# Patient Record
Sex: Female | Born: 1970 | Race: White | Hispanic: Yes | Marital: Married | Smoking: Never smoker
Health system: Southern US, Community
[De-identification: ages and names within clinical notes are randomized; demographics above are authoritative.]

## PROBLEM LIST (undated history)

## (undated) DIAGNOSIS — F419 Anxiety disorder, unspecified: Secondary | ICD-10-CM

## (undated) DIAGNOSIS — Z8669 Personal history of other diseases of the nervous system and sense organs: Secondary | ICD-10-CM

## (undated) DIAGNOSIS — E785 Hyperlipidemia, unspecified: Secondary | ICD-10-CM

## (undated) DIAGNOSIS — K219 Gastro-esophageal reflux disease without esophagitis: Secondary | ICD-10-CM

## (undated) DIAGNOSIS — E039 Hypothyroidism, unspecified: Secondary | ICD-10-CM

## (undated) DIAGNOSIS — E079 Disorder of thyroid, unspecified: Secondary | ICD-10-CM

## (undated) DIAGNOSIS — Z30430 Encounter for insertion of intrauterine contraceptive device: Secondary | ICD-10-CM

## (undated) HISTORY — DX: Hypothyroidism, unspecified: E03.9

## (undated) HISTORY — PX: NASAL SEPTUM SURGERY: SHX37

## (undated) HISTORY — PX: LIPOSUCTION: SHX10

## (undated) HISTORY — DX: Disorder of thyroid, unspecified: E07.9

## (undated) HISTORY — DX: Hyperlipidemia, unspecified: E78.5

## (undated) HISTORY — PX: CERVICAL DISCECTOMY: SHX98

## (undated) HISTORY — PX: RADIUS OSTEOTOMY: SHX1038

## (undated) HISTORY — DX: Encounter for insertion of intrauterine contraceptive device: Z30.430

## (undated) HISTORY — DX: Personal history of other diseases of the nervous system and sense organs: Z86.69

## (undated) HISTORY — PX: TONSILLECTOMY: SUR1361

---

## 2011-11-09 DIAGNOSIS — Z30430 Encounter for insertion of intrauterine contraceptive device: Secondary | ICD-10-CM

## 2011-11-09 HISTORY — DX: Encounter for insertion of intrauterine contraceptive device: Z30.430

## 2014-07-13 ENCOUNTER — Telehealth: Payer: Self-pay | Admitting: Obstetrics and Gynecology

## 2014-07-13 NOTE — Telephone Encounter (Signed)
Attempted to leave msg, spouse scheduled this appointment. I forgot to ask if his wife needs an interpreter.

## 2014-08-05 ENCOUNTER — Encounter: Payer: Self-pay | Admitting: Obstetrics and Gynecology

## 2014-09-30 ENCOUNTER — Ambulatory Visit (INDEPENDENT_AMBULATORY_CARE_PROVIDER_SITE_OTHER): Payer: BC Managed Care – PPO | Admitting: Obstetrics and Gynecology

## 2014-09-30 ENCOUNTER — Encounter: Payer: Self-pay | Admitting: Obstetrics and Gynecology

## 2014-09-30 VITALS — BP 108/68 | HR 70 | Resp 14 | Ht 67.0 in | Wt 174.0 lb

## 2014-09-30 DIAGNOSIS — Z0000 Encounter for general adult medical examination without abnormal findings: Secondary | ICD-10-CM

## 2014-09-30 DIAGNOSIS — E785 Hyperlipidemia, unspecified: Secondary | ICD-10-CM

## 2014-09-30 DIAGNOSIS — E039 Hypothyroidism, unspecified: Secondary | ICD-10-CM | POA: Insufficient documentation

## 2014-09-30 DIAGNOSIS — Z01419 Encounter for gynecological examination (general) (routine) without abnormal findings: Secondary | ICD-10-CM

## 2014-09-30 HISTORY — DX: Hyperlipidemia, unspecified: E78.5

## 2014-09-30 HISTORY — DX: Hypothyroidism, unspecified: E03.9

## 2014-09-30 LAB — POCT URINALYSIS DIPSTICK
Leukocytes, UA: NEGATIVE
Protein, UA: 0
Urobilinogen, UA: NEGATIVE
pH, UA: 5

## 2014-09-30 LAB — CBC
HCT: 42.2 % (ref 36.0–46.0)
Hemoglobin: 14.8 g/dL (ref 12.0–15.0)
MCH: 30 pg (ref 26.0–34.0)
MCHC: 35.1 g/dL (ref 30.0–36.0)
MCV: 85.4 fL (ref 78.0–100.0)
PLATELETS: 265 10*3/uL (ref 150–400)
RBC: 4.94 MIL/uL (ref 3.87–5.11)
RDW: 13.7 % (ref 11.5–15.5)
WBC: 7 10*3/uL (ref 4.0–10.5)

## 2014-09-30 LAB — HEMOGLOBIN, FINGERSTICK: HEMOGLOBIN, FINGERSTICK: 14.6 g/dL (ref 12.0–16.0)

## 2014-09-30 NOTE — Progress Notes (Signed)
GYNECOLOGY VISIT  PCP: None  Referring provider:  None  HPI: 43 y.o.   Married  Caucasian  female from [place]. [id number] with No LMP recorded. Patient is not currently having periods (Reason: IUD).   here for annual exam.  Has Mirena IUD and likes it.  Every 2 - 3 months has very little bleeding.  LMP was really 2 year ago.  Sister with breast cancer diagnosed at age 31.  She did surgery, chemo, and radiation therapy.   Hgb:  14.6  Urine:  Negative  GYNECOLOGIC HISTORY: No LMP recorded. Patient is not currently having periods (Reason: IUD). Sexually active:  yes Partner preference:  Female  Contraception:   Mirena IUD Menopausal hormone therapy: none DES exposure:   no Blood transfusions:   no  Sexually transmitted diseases:   no GYN procedures and prior surgeries: none  Last mammogram: 04/2013- Normal  Last pap and high risk HPV testing:   2012- Normal  History of abnormal pap smear:  no   OB History   Grav Para Term Preterm Abortions TAB SAB Ect Mult Living   2 2 2       2        LIFESTYLE: Exercise:   No current exercise at the moment              OTHER HEALTH MAINTENANCE: Tetanus/TDap: 02/2011 HPV: n/a Influenza:  none   Bone density:  n/a Colonoscopy: n/a  Cholesterol check:  no  Family History  Problem Relation Age of Onset  . Stomach cancer Father   . Breast cancer Sister   . Stomach cancer Paternal Grandmother   . Stomach cancer Paternal Aunt   . Heart attack Mother   . Stroke Mother     There are no active problems to display for this patient.  Past Medical History  Diagnosis Date  . Encounter for insertion of mirena IUD 11/09/2011  . Thyroid disease   . Hypothyroidism     Past Surgical History  Procedure Laterality Date  . Nasal septum surgery    . Cesarean section  x2  . Tonsillectomy      ALLERGIES: Review of patient's allergies indicates no known allergies.  Current Outpatient Prescriptions  Medication Sig Dispense Refill  .  levonorgestrel (MIRENA) 20 MCG/24HR IUD 1 each by Intrauterine route once. Inserted 11/09/11      . levothyroxine (SYNTHROID, LEVOTHROID) 88 MCG tablet Take 88 mcg by mouth daily before breakfast.       No current facility-administered medications for this visit.     ROS:  Pertinent items are noted in HPI.  History   Social History  . Marital Status: Married    Spouse Name: N/A    Number of Children: N/A  . Years of Education: N/A   Occupational History  . Not on file.   Social History Main Topics  . Smoking status: Never Smoker   . Smokeless tobacco: Never Used  . Alcohol Use: No  . Drug Use: No  . Sexual Activity: Yes    Partners: Male    Birth Control/ Protection: IUD   Other Topics Concern  . Not on file   Social History Narrative  . No narrative on file    PHYSICAL EXAMINATION:    BP 108/68  Pulse 70  Resp 14  Ht 5' 7"  (1.702 m)  Wt 174 lb (78.926 kg)  BMI 27.25 kg/m2   Wt Readings from Last 3 Encounters:  09/30/14 174 lb (78.926 kg)  Ht Readings from Last 3 Encounters:  09/30/14 5' 7"  (1.702 m)    General appearance: alert, cooperative and appears stated age Head: Normocephalic, without obvious abnormality, atraumatic Neck: no adenopathy, supple, symmetrical, trachea midline and thyroid not enlarged, symmetric, no tenderness/mass/nodules Lungs: clear to auscultation bilaterally Breasts: Inspection negative, No nipple retraction or dimpling, No nipple discharge or bleeding, No axillary or supraclavicular adenopathy, Normal to palpation without dominant masses Heart: regular rate and rhythm Abdomen: soft, non-tender; no masses,  no organomegaly Extremities: extremities normal, atraumatic, no cyanosis or edema Skin: Skin color, texture, turgor normal. No rashes or lesions Lymph nodes: Cervical, supraclavicular, and axillary nodes normal. No abnormal inguinal nodes palpated Neurologic: Grossly normal  Pelvic: External genitalia:  no lesions               Urethra:  normal appearing urethra with no masses, tenderness or lesions              Bartholins and Skenes: normal                 Vagina: normal appearing vagina with normal color and discharge, no lesions              Cervix: normal appearance.  IUD strings noted.  Dark blood in vagina.              Pap and high risk HPV testing done: Yes.          Bimanual Exam:  Uterus:  uterus is normal size, shape, consistency and nontender                                      Adnexa: normal adnexa in size, nontender and no masses                                      Rectovaginal:  Yes.                                        Confirms above.                                      Anus:  normal sphincter tone, no lesions  ASSESSMENT  Normal gynecologic exam. Mirena IUD patient.  Family history of premenopausal breast cancer.  Hypothyroidism.   PLAN  Mammogram recommended yearly starting at age 65. Discussed BRCA testing. Patient will ask her sister is this was done in [place]. Pap smear and high risk HPV testing as above. Counseled on self breast exam. See lab orders: Yes.   Patient will get her Synthroid Rx here in the future when she runs out. Return annually or prn   An After Visit Summary was printed and given to the patient.

## 2014-09-30 NOTE — Patient Instructions (Signed)
EXERCISE AND DIET:  We recommended that you start or continue a regular exercise program for good health. Regular exercise means any activity that makes your heart beat faster and makes you sweat.  We recommend exercising at least 30 minutes per day at least 3 days a week, preferably 4 or 5.  We also recommend a diet low in fat and sugar.  Inactivity, poor dietary choices and obesity can cause diabetes, heart attack, stroke, and kidney damage, among others.    ALCOHOL AND SMOKING:  Women should limit their alcohol intake to no more than 7 drinks/beers/glasses of wine (combined, not each!) per week. Moderation of alcohol intake to this level decreases your risk of breast cancer and liver damage. And of course, no recreational drugs are part of a healthy lifestyle.  And absolutely no smoking or even second hand smoke. Most people know smoking can cause heart and lung diseases, but did you know it also contributes to weakening of your bones? Aging of your skin?  Yellowing of your teeth and nails?  CALCIUM AND VITAMIN D:  Adequate intake of calcium and Vitamin D are recommended.  The recommendations for exact amounts of these supplements seem to change often, but generally speaking 600 mg of calcium (either carbonate or citrate) and 800 units of Vitamin D per day seems prudent. Certain women may benefit from higher intake of Vitamin D.  If you are among these women, your doctor will have told you during your visit.    PAP SMEARS:  Pap smears, to check for cervical cancer or precancers,  have traditionally been done yearly, although recent scientific advances have shown that most women can have pap smears less often.  However, every woman still should have a physical exam from her gynecologist every year. It will include a breast check, inspection of the vulva and vagina to check for abnormal growths or skin changes, a visual exam of the cervix, and then an exam to evaluate the size and shape of the uterus and  ovaries.  And after 43 years of age, a rectal exam is indicated to check for rectal cancers. We will also provide age appropriate advice regarding health maintenance, like when you should have certain vaccines, screening for sexually transmitted diseases, bone density testing, colonoscopy, mammograms, etc.   MAMMOGRAMS:  All women over 43 years old should have a yearly mammogram. Many facilities now offer a "3D" mammogram, which may cost around $50 extra out of pocket. If possible,  we recommend you accept the option to have the 3D mammogram performed.  It both reduces the number of women who will be called back for extra views which then turn out to be normal, and it is better than the routine mammogram at detecting truly abnormal areas.    COLONOSCOPY:  Colonoscopy to screen for colon cancer is recommended for all women at age 18.  We know, you hate the idea of the prep.  We agree, BUT, having colon cancer and not knowing it is worse!!  Colon cancer so often starts as a polyp that can be seen and removed at colonscopy, which can quite literally save your life!  And if your first colonoscopy is normal and you have no family history of colon cancer, most women don't have to have it again for 10 years.  Once every ten years, you can do something that may end up saving your life, right?  We will be happy to help you get it scheduled when you are ready.  Be sure to check your insurance coverage so you understand how much it will cost.  It may be covered as a preventative service at no cost, but you should check your particular policy.     BRCA-1 and BRCA-2 BRCA-1 and BRCA-2 are 2 genes that are linked with hereditary breast and ovarian cancers. About 200,000 women are diagnosed with invasive breast cancer each year and about 23,000 with ovarian cancer (according to the [place]). Of these cancers, about 5% to 10% will be due to a mutation in one of the BRCA genes. Men can also inherit an  increased risk of developing breast cancer, primarily from an alteration in the BRCA-2 gene.  Individuals with mutations in BRCA1 or BRCA2 have significantly elevated risks for breast cancer (up to 80% lifetime risk), ovarian cancer (up to 40% lifetime risk), bilateral breast cancer and other types of cancers. BRCA mutations are inherited and passed from generation to generation. One half of the time, they are passed from the father's side of the family.  The DNA in white blood cells is used to detect mutations in the BRCA genes. While the gene products (proteins) of the BRCA genes act only in breast and ovarian tissue, the genes are present in every cell of the body and blood is the most easily accessible source of that DNA. PREPARATION FOR TEST The test for BRCA mutations is done on a blood sample collected by needle from a vein in the arm. The test does not require surgical biopsy of breast or ovarian tissue.  NORMAL FINDINGS No genetic mutations. Ranges for normal findings may vary among different laboratories and hospitals. You should always check with your doctor after having lab work or other tests done to discuss the meaning of your test results and whether your values are considered within normal limits. MEANING OF TEST  Your caregiver will go over the test results with you and discuss the importance and meaning of your results, as well as treatment options and the need for additional tests if necessary. OBTAINING THE TEST RESULTS It is your responsibility to obtain your test results. Ask the lab or department performing the test when and how you will get your results. OTHER THINGS TO KNOW Your test results may have implications for other family members. When one member of a family is tested for BRCA mutations, issues often arise about how or whether to share this information with other family members. Seek advice from a genetic counselor about communication of result with your family members.   Pre and post test consultation with a health care provider knowledgeable about genetic testing cannot be overemphasized.  There are many issues to be considered when preparing for a genetic test and upon learning the results, and a genetic counselor has the knowledge and experience to help you sort through them.  If the BRCA test is positive, the options include increased frequency of check-ups (e.g., mammography, blood tests for CA-125, or transvaginal ultrasonography); medications that could reduce risk (e.g., oral contraceptives or tamoxifen); or surgical removal of the ovaries or breasts. There are a number of variables involved and it is important to discuss your options with your doctor and genetic counselor. Research studies have reported that for every 1000 women negative for BRCA mutations, between 12 and 18 of them will develop breast cancer by age 8 and between 68 and 3 will develop ovarian cancer by age 81. The risk increases with age. The test can be ordered by a doctor,  preferably by one who can also offer genetic counseling. The blood sample will be sent to a laboratory that specializes in BRCA testing. The American Society of Clinical Oncology and the [place] encourage women seeking the test to participate in long-term outcome studies to help gather information on the effectiveness of different check-up and treatment options. Document Released: 12/20/2004 Document Revised: 02/18/2012 Document Reviewed: 02/26/2014 [facility] Patient Information 2015 [place], [place]. This information is not intended to replace advice given to you by your health care provider. Make sure you discuss any questions you have with your health care provider.

## 2014-10-01 LAB — LIPID PANEL
CHOL/HDL RATIO: 5 ratio
Cholesterol: 229 mg/dL — ABNORMAL HIGH (ref 0–200)
HDL: 46 mg/dL (ref >39)
LDL Cholesterol: 140 mg/dL — ABNORMAL HIGH (ref 0–99)
Triglycerides: 217 mg/dL — ABNORMAL HIGH (ref <150)
VLDL: 43 mg/dL — AB (ref 0–40)

## 2014-10-01 LAB — COMPREHENSIVE METABOLIC PANEL
ALT: 19 U/L (ref 0–35)
AST: 14 U/L (ref 0–37)
Albumin: 3.9 g/dL (ref 3.5–5.2)
Alkaline Phosphatase: 70 U/L (ref 39–117)
BILIRUBIN TOTAL: 0.5 mg/dL (ref 0.2–1.2)
BUN: 13 mg/dL (ref 6–23)
CHLORIDE: 103 meq/L (ref 96–112)
CO2: 27 meq/L (ref 19–32)
Calcium: 9.7 mg/dL (ref 8.4–10.5)
Creat: 0.97 mg/dL (ref 0.50–1.10)
Glucose, Bld: 84 mg/dL (ref 70–99)
Potassium: 4.5 mEq/L (ref 3.5–5.3)
SODIUM: 140 meq/L (ref 135–145)
TOTAL PROTEIN: 6.8 g/dL (ref 6.0–8.3)

## 2014-10-01 LAB — THYROID PANEL WITH TSH
Free Thyroxine Index: 2.3 (ref 1.4–3.8)
T3 Uptake: 30 % (ref 22.0–35.0)
T4, Total: 7.6 ug/dL (ref 4.5–12.0)
TSH: 2.243 u[IU]/mL (ref 0.350–4.500)

## 2014-10-02 ENCOUNTER — Other Ambulatory Visit: Payer: Self-pay | Admitting: Obstetrics and Gynecology

## 2014-10-02 DIAGNOSIS — E785 Hyperlipidemia, unspecified: Secondary | ICD-10-CM

## 2014-10-04 ENCOUNTER — Telehealth: Payer: Self-pay

## 2014-10-04 LAB — IPS PAP TEST WITH HPV

## 2014-10-04 NOTE — Telephone Encounter (Signed)
Called patient at [phone] to discuss lab results, LMOVM to call me.

## 2014-10-04 NOTE — Telephone Encounter (Signed)
Message copied by Lowella Fairy on Mon Oct 04, 2014 11:29 AM ------      Message from: Judeth Horn DE Milana Kidney E      Created: Sat Oct 02, 2014  3:39 PM       Please report labs to patient.       Cholesterol is elevated and unfavorable ratios.      I would like for the patient to start an exercise program and follow a low fat and low cholesterol diet and then return in 3 months for fasting blood work.      All remaining blood work including thyroid was normal. ------

## 2014-10-11 NOTE — Telephone Encounter (Signed)
Patient notified and made lab appointment for 01-03-15 at 9:00am. Fasting.

## 2014-10-12 ENCOUNTER — Encounter: Payer: Self-pay | Admitting: Obstetrics and Gynecology

## 2014-10-20 ENCOUNTER — Other Ambulatory Visit: Payer: Self-pay

## 2014-10-20 DIAGNOSIS — Z1231 Encounter for screening mammogram for malignant neoplasm of breast: Secondary | ICD-10-CM

## 2014-10-26 ENCOUNTER — Ambulatory Visit
Admission: RE | Admit: 2014-10-26 | Discharge: 2014-10-26 | Disposition: A | Discharge status: Home / Self Care | Payer: BC Managed Care – PPO | Source: Ambulatory Visit

## 2014-10-26 DIAGNOSIS — Z1231 Encounter for screening mammogram for malignant neoplasm of breast: Secondary | ICD-10-CM

## 2014-10-28 ENCOUNTER — Other Ambulatory Visit: Payer: Self-pay | Admitting: Obstetrics and Gynecology

## 2014-10-28 DIAGNOSIS — R928 Other abnormal and inconclusive findings on diagnostic imaging of breast: Secondary | ICD-10-CM

## 2014-11-12 ENCOUNTER — Ambulatory Visit
Admission: RE | Admit: 2014-11-12 | Discharge: 2014-11-12 | Disposition: A | Discharge status: Home / Self Care | Payer: BC Managed Care – PPO | Source: Ambulatory Visit | Attending: Obstetrics and Gynecology | Admitting: Obstetrics and Gynecology

## 2014-11-12 DIAGNOSIS — R928 Other abnormal and inconclusive findings on diagnostic imaging of breast: Secondary | ICD-10-CM

## 2014-11-17 ENCOUNTER — Telehealth: Payer: Self-pay | Admitting: Emergency Medicine

## 2014-11-17 NOTE — Telephone Encounter (Signed)
-----   Message from Holley, MD sent at 11/14/2014 10:43 AM EST ----- Regarding: Please remove from mammogram hold Hi Olivia Mackie,   Follow up mammogram reviewed and normal.  OK to remove from mammogram hold.   Thanks,   [place]

## 2014-11-17 NOTE — Telephone Encounter (Signed)
Out of Mammogram hold.

## 2015-01-03 ENCOUNTER — Other Ambulatory Visit: Payer: BC Managed Care – PPO

## 2015-01-03 ENCOUNTER — Telehealth: Payer: Self-pay | Admitting: Obstetrics and Gynecology

## 2015-01-03 NOTE — Telephone Encounter (Signed)
Left message to call and reschedule missed lab appointment. °

## 2015-01-05 NOTE — Telephone Encounter (Signed)
Called pt. At [phone] to r/s missed lab appt. (fasting lipid profile), LMOVM to call me back.  Patient was schedule on 01-03-15 but did not keep appt.

## 2015-01-17 NOTE — Telephone Encounter (Signed)
Dr. Quincy Simmonds,  We have tried twice to contact patient to reschedule her 3 mo. Fasting lipid profile and she has not responded.  No further action?  Routed to Dr. Quincy Simmonds

## 2015-01-18 NOTE — Telephone Encounter (Signed)
No further action required.  Patient responsibility to return call and reschedule. Encounter closed.

## 2015-08-11 ENCOUNTER — Encounter: Payer: Self-pay | Admitting: Obstetrics and Gynecology

## 2015-08-11 ENCOUNTER — Telehealth: Payer: Self-pay | Admitting: Obstetrics and Gynecology

## 2015-08-11 NOTE — Telephone Encounter (Signed)
(  unable to lv msg mail box full) upcoming appointment has been canceled and needs to be rescheduled. Letter mailed

## 2015-10-05 ENCOUNTER — Ambulatory Visit: Payer: BC Managed Care – PPO | Admitting: Obstetrics and Gynecology

## 2015-10-09 ENCOUNTER — Encounter: Payer: Self-pay | Admitting: Obstetrics and Gynecology

## 2015-12-30 ENCOUNTER — Ambulatory Visit (INDEPENDENT_AMBULATORY_CARE_PROVIDER_SITE_OTHER): Payer: BLUE CROSS/BLUE SHIELD | Admitting: Pulmonary Disease

## 2015-12-30 ENCOUNTER — Encounter: Payer: Self-pay | Admitting: Pulmonary Disease

## 2015-12-30 ENCOUNTER — Other Ambulatory Visit (INDEPENDENT_AMBULATORY_CARE_PROVIDER_SITE_OTHER): Payer: BLUE CROSS/BLUE SHIELD

## 2015-12-30 ENCOUNTER — Ambulatory Visit (INDEPENDENT_AMBULATORY_CARE_PROVIDER_SITE_OTHER)
Admission: RE | Admit: 2015-12-30 | Discharge: 2015-12-30 | Disposition: A | Discharge status: Home / Self Care | Payer: BLUE CROSS/BLUE SHIELD | Source: Ambulatory Visit | Attending: Pulmonary Disease | Admitting: Pulmonary Disease

## 2015-12-30 VITALS — BP 132/84 | HR 90 | Ht 67.0 in | Wt 189.6 lb

## 2015-12-30 DIAGNOSIS — R0689 Other abnormalities of breathing: Secondary | ICD-10-CM | POA: Diagnosis not present

## 2015-12-30 DIAGNOSIS — R0600 Dyspnea, unspecified: Secondary | ICD-10-CM

## 2015-12-30 LAB — CBC WITH DIFFERENTIAL/PLATELET
BASOS ABS: 0.1 10*3/uL (ref 0.0–0.1)
Basophils Relative: 1.5 % (ref 0.0–3.0)
EOS ABS: 0.2 10*3/uL (ref 0.0–0.7)
Eosinophils Relative: 2.2 % (ref 0.0–5.0)
HEMATOCRIT: 44.9 % (ref 36.0–46.0)
HEMOGLOBIN: 15 g/dL (ref 12.0–15.0)
LYMPHS PCT: 27.2 % (ref 12.0–46.0)
Lymphs Abs: 2.3 10*3/uL (ref 0.7–4.0)
MCHC: 33.3 g/dL (ref 30.0–36.0)
MCV: 86.8 fl (ref 78.0–100.0)
MONOS PCT: 8.6 % (ref 3.0–12.0)
Monocytes Absolute: 0.7 10*3/uL (ref 0.1–1.0)
NEUTROS ABS: 5.2 10*3/uL (ref 1.4–7.7)
Neutrophils Relative %: 60.5 % (ref 43.0–77.0)
PLATELETS: 281 10*3/uL (ref 150.0–400.0)
RBC: 5.18 Mil/uL — AB (ref 3.87–5.11)
RDW: 13.8 % (ref 11.5–15.5)
WBC: 8.6 10*3/uL (ref 4.0–10.5)

## 2015-12-30 MED ORDER — ALBUTEROL SULFATE HFA 108 (90 BASE) MCG/ACT IN AERS: 2.0000 | INHALATION_SPRAY | Freq: Four times a day (QID) | RESPIRATORY_TRACT | Status: DC | PRN

## 2015-12-30 NOTE — Patient Instructions (Signed)
We will check chest x-ray and pulmonary function test, methacholine challenge test Blood tests for a complete blood count with differential, IgE levels. You will be prescribed an albuterol rescue inhaler to be used during episodes of chest pain and shortness of breath. Return to clinic in 1-2 months

## 2015-12-30 NOTE — Progress Notes (Signed)
Subjective:    Patient ID: Brianna [name], female    DOB: November 13, 1971, 45 y.o.   MRN: [id number]  HPI [name] is a 45 year old referred for evaluation of dyspnea, abnormal PFTs.  She complains of substernal chest tightness and chest pain for several years. This has exacerbated over the past 4 months. It occurs almost every day brought on when she is rushed, nervous, worried, anxious. This is associated with shortness of breath. There is no associated wheeze, cough, sputum production, palpitations. Sometimes the pain radiates to the back. She has seasonal allergies and GERD. She was started on prilocec recently and has an improvement in GERD symptoms. She has a cat and dog at home and does not report any sensitivity to those.  She has spirometry done 3 months ago which showed moderate obstruction. She was tried on [employer] but has not noticed any improvement in symptoms. She was born in [place] and moved to the [place] about 10 years ago. She does not have any TB exposure. She was vaccinated with BCG as a child. She was tested for latent TB at work 1 year ago with a CXR, PPD and a blood test. She reports that these were negative.   PFTs 09/01/15 FVC 3.32 (87%] FEV1 2.16 [69%) F/F 65 Moderate obstruction  Social history: She is a never smoker no alcohol or illegal drug use. 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 She does not report any exposures at work or at home.  Family history: Mother-heart disease Father, sister-cancer  Past Medical History  Diagnosis Date  . Encounter for insertion of mirena IUD 11/09/2011  . Thyroid disease   . Hypothyroidism 09/30/2014  . Hyperlipidemia 09/30/14    Current outpatient prescriptions:  .  acyclovir (ZOVIRAX) 400 MG tablet, Take 400 mg by mouth 3 (three) times daily as needed., Disp: , Rfl: 2 .  calcium carbonate (TUMS - DOSED IN MG ELEMENTAL CALCIUM) 500 MG chewable tablet, Chew 1 tablet by mouth 4 (four) times daily., Disp: , Rfl:   .  ibuprofen (ADVIL,MOTRIN) 200 MG tablet, Per bottle as needed, Disp: , Rfl:  .  levonorgestrel (MIRENA) 20 MCG/24HR IUD, 1 each by Intrauterine route once. Inserted 11/09/11, Disp: , Rfl:  .  levothyroxine (SYNTHROID, LEVOTHROID) 88 MCG tablet, Take 88 mcg by mouth daily before breakfast., Disp: , Rfl:  .  LORazepam (ATIVAN) 0.5 MG tablet, Take 0.5 mg by mouth daily as needed., Disp: , Rfl: 0 .  omeprazole (PRILOSEC) 40 MG capsule, Take 40 mg by mouth daily as needed., Disp: , Rfl:  .  albuterol (PROVENTIL HFA;VENTOLIN HFA) 108 (90 Base) MCG/ACT inhaler, Inhale 2 puffs into the lungs every 6 (six) hours as needed for wheezing or shortness of breath., Disp: 1 Inhaler, Rfl: 2  Review of Systems Denies any cough, sputum production, dyspnea, wheezing. Denies any palpitations, fevers, chills, loss of weight, loss of appetite, malaise, fatigue. Denies any nausea, vomiting, diarrhea, constipation. All other review of systems are negative    Objective:   Physical Exam Blood pressure 132/84, pulse 90, height 5\' 7"  (1.702 m), weight 189 lb 9.6 oz (86.002 kg), SpO2 97 %. Gen: No apparent distress Neuro: No gross focal deficits. Neck: No JVD, lymphadenopathy, thyromegaly. RS: Clear, no wheeze or crackles.  CVS: S1-S2 heard, no murmurs rubs gallops. Abdomen: Soft, positive bowel sounds. Extremities: No edema.    Assessment & Plan:  Dyspnea, atypical chest pain.  Symptoms are of unclear etiology. This may be related to her  anxiety. Her spirometry show moderate obstruction but she does not have any smoking history or known exposures. There is no family history of lung disease. She has not responded to [employer].  I will evaluate by getting a full set of lung function tests with lung volume and diffusion capacity, methacholine challenge test. She also get a chest x-ray and basic blood work. I will give her albuterol rescue inhaler.  Return to clinic in 1 month to review results and any other  workup if needed.  Plan: - PFTs, Methacholine challenge - CBC with diff, IgE - CXR - Albuterol rescue inhaler.  Marshell Garfinkel MD [facility] Pager [phone] If no answer or after 3pm call: [phone] 12/30/2015, 4:19 PM

## 2016-01-02 LAB — IGE: IGE (IMMUNOGLOBULIN E), SERUM: 14 kU/L (ref <115)

## 2016-01-09 ENCOUNTER — Ambulatory Visit (HOSPITAL_COMMUNITY)
Admission: RE | Admit: 2016-01-09 | Discharge: 2016-01-09 | Disposition: A | Discharge status: Home / Self Care | Payer: BLUE CROSS/BLUE SHIELD | Source: Ambulatory Visit | Attending: Pulmonary Disease | Admitting: Pulmonary Disease

## 2016-01-09 DIAGNOSIS — R0600 Dyspnea, unspecified: Secondary | ICD-10-CM | POA: Insufficient documentation

## 2016-01-09 LAB — PULMONARY FUNCTION TEST
FEF 25-75 POST: 2.32 L/s
FEF 25-75 PRE: 2.13 L/s
FEF2575-%CHANGE-POST: 9 %
FEF2575-%PRED-POST: 74 %
FEF2575-%Pred-Pre: 67 %
FEV1-%CHANGE-POST: 2 %
FEV1-%PRED-POST: 74 %
FEV1-%Pred-Pre: 73 %
FEV1-POST: 2.4 L
FEV1-Pre: 2.35 L
FEV1FVC-%Change-Post: 12 %
FEV1FVC-%PRED-PRE: 94 %
FEV6-%Change-Post: -9 %
FEV6-%Pred-Post: 71 %
FEV6-%Pred-Pre: 78 %
FEV6-POST: 2.79 L
FEV6-Pre: 3.08 L
FEV6FVC-%PRED-POST: 102 %
FEV6FVC-%Pred-Pre: 102 %
FVC-%Change-Post: -9 %
FVC-%PRED-POST: 69 %
FVC-%PRED-PRE: 76 %
FVC-PRE: 3.08 L
FVC-Post: 2.79 L
POST FEV1/FVC RATIO: 86 %
PRE FEV1/FVC RATIO: 76 %
Post FEV6/FVC ratio: 100 %
Pre FEV6/FVC Ratio: 100 %

## 2016-01-09 MED ORDER — METHACHOLINE 1 MG/ML NEB SOLN
2.0000 mL | Freq: Once | RESPIRATORY_TRACT | Status: AC
  Administered 2016-01-09: 2 mg via RESPIRATORY_TRACT

## 2016-01-09 MED ORDER — METHACHOLINE 4 MG/ML NEB SOLN
2.0000 mL | Freq: Once | RESPIRATORY_TRACT | Status: AC
  Administered 2016-01-09: 8 mg via RESPIRATORY_TRACT

## 2016-01-09 MED ORDER — ALBUTEROL SULFATE (2.5 MG/3ML) 0.083% IN NEBU
2.5000 mg | INHALATION_SOLUTION | Freq: Once | RESPIRATORY_TRACT | Status: AC
  Administered 2016-01-09: 2.5 mg via RESPIRATORY_TRACT

## 2016-01-09 MED ORDER — METHACHOLINE 0.25 MG/ML NEB SOLN
2.0000 mL | Freq: Once | RESPIRATORY_TRACT | Status: AC
  Administered 2016-01-09: 0.5 mg via RESPIRATORY_TRACT

## 2016-01-09 MED ORDER — METHACHOLINE 16 MG/ML NEB SOLN
2.0000 mL | Freq: Once | RESPIRATORY_TRACT | Status: AC
  Administered 2016-01-09: 32 mg via RESPIRATORY_TRACT

## 2016-01-09 MED ORDER — SODIUM CHLORIDE 0.9 % IN NEBU
3.0000 mL | INHALATION_SOLUTION | Freq: Once | RESPIRATORY_TRACT | Status: AC
  Administered 2016-01-09: 3 mL via RESPIRATORY_TRACT

## 2016-01-09 MED ORDER — METHACHOLINE 0.0625 MG/ML NEB SOLN
2.0000 mL | Freq: Once | RESPIRATORY_TRACT | Status: AC
  Administered 2016-01-09: 0.125 mg via RESPIRATORY_TRACT

## 2016-01-10 ENCOUNTER — Ambulatory Visit (HOSPITAL_COMMUNITY)
Admission: RE | Admit: 2016-01-10 | Discharge: 2016-01-10 | Disposition: A | Discharge status: Home / Self Care | Payer: BLUE CROSS/BLUE SHIELD | Source: Ambulatory Visit | Attending: Pulmonary Disease | Admitting: Pulmonary Disease

## 2016-01-10 DIAGNOSIS — R0600 Dyspnea, unspecified: Secondary | ICD-10-CM | POA: Diagnosis present

## 2016-01-10 LAB — PULMONARY FUNCTION TEST
DL/VA % PRED: 102 %
DL/VA: 5.3 ml/min/mmHg/L
DLCO cor % pred: 73 %
DLCO cor: 20.89 ml/min/mmHg
DLCO unc % pred: 77 %
DLCO unc: 21.85 ml/min/mmHg
FEF 25-75 Post: 3.16 L/sec
FEF 25-75 Pre: 2.91 L/sec
FEF2575-%CHANGE-POST: 8 %
FEF2575-%PRED-PRE: 93 %
FEF2575-%Pred-Post: 100 %
FEV1-%CHANGE-POST: 1 %
FEV1-%PRED-PRE: 76 %
FEV1-%Pred-Post: 77 %
FEV1-POST: 2.49 L
FEV1-PRE: 2.46 L
FEV1FVC-%CHANGE-POST: 4 %
FEV1FVC-%Pred-Pre: 102 %
FEV6-%CHANGE-POST: -2 %
FEV6-%PRED-PRE: 74 %
FEV6-%Pred-Post: 72 %
FEV6-PRE: 2.92 L
FEV6-Post: 2.86 L
FEV6FVC-%PRED-PRE: 102 %
FEV6FVC-%Pred-Post: 102 %
FVC-%CHANGE-POST: -3 %
FVC-%PRED-POST: 71 %
FVC-%PRED-PRE: 73 %
FVC-POST: 2.86 L
FVC-Pre: 2.96 L
POST FEV6/FVC RATIO: 100 %
PRE FEV6/FVC RATIO: 100 %
Post FEV1/FVC ratio: 87 %
Pre FEV1/FVC ratio: 83 %
RV % PRED: 66 %
RV: 1.22 L
TLC % PRED: 73 %
TLC: 4.06 L

## 2016-01-10 MED ORDER — ALBUTEROL SULFATE (2.5 MG/3ML) 0.083% IN NEBU
2.5000 mg | INHALATION_SOLUTION | Freq: Once | RESPIRATORY_TRACT | Status: AC
  Administered 2016-01-10: 2.5 mg via RESPIRATORY_TRACT

## 2016-01-18 ENCOUNTER — Telehealth: Payer: Self-pay | Admitting: Pulmonary Disease

## 2016-01-18 NOTE — Telephone Encounter (Signed)
LVM for patient to return call. 

## 2016-01-19 NOTE — Telephone Encounter (Signed)
Checked DPR and spouse is on it to speak with.  They are requesting CXR, PFT and methacholine challenge test results. Please advise Dr. Vaughan Browner thanks

## 2016-01-19 NOTE — Telephone Encounter (Signed)
LMTCB

## 2016-01-19 NOTE — Telephone Encounter (Signed)
I called but got the voice mail. Please let then know that the CXR and blood work are normal. The lung function tests and methacholine challenge show mild abnormalities c/w obstruction. There is evidence of asthma.

## 2016-01-20 NOTE — Telephone Encounter (Signed)
LMTCB x 1 

## 2016-01-20 NOTE — Telephone Encounter (Signed)
lmtcb x2 for pt. 

## 2016-01-20 NOTE — Telephone Encounter (Signed)
Pt husband returning call and can be reached [phone].Brianna [name]

## 2016-01-23 NOTE — Telephone Encounter (Signed)
Spoke with patient, advised her of results below.  Patient wants to know if she needs to schedule a follow up visit, what is the next step?   Dr. Vaughan Browner, please advise.

## 2016-01-24 NOTE — Telephone Encounter (Signed)
lmtcb X1 for pt  

## 2016-01-24 NOTE — Telephone Encounter (Signed)
She can follow up if she want to discuss the results results in person otherwise no follow up needed.

## 2016-01-25 NOTE — Telephone Encounter (Signed)
lmtcb x2 for pt. 

## 2016-01-26 NOTE — Telephone Encounter (Signed)
lmtcb for spouse.  

## 2016-01-27 NOTE — Telephone Encounter (Signed)
lmtcb X4 for pt.  Will close message per triage protocol.  

## 2016-09-10 ENCOUNTER — Other Ambulatory Visit: Payer: Self-pay | Admitting: Obstetrics and Gynecology

## 2016-09-10 DIAGNOSIS — Z1231 Encounter for screening mammogram for malignant neoplasm of breast: Secondary | ICD-10-CM

## 2016-09-14 ENCOUNTER — Ambulatory Visit
Admission: RE | Admit: 2016-09-14 | Discharge: 2016-09-14 | Disposition: A | Discharge status: Home / Self Care | Payer: BLUE CROSS/BLUE SHIELD | Source: Ambulatory Visit | Attending: Neurosurgery | Admitting: Neurosurgery

## 2016-09-14 ENCOUNTER — Other Ambulatory Visit: Payer: Self-pay | Admitting: Neurosurgery

## 2016-09-14 DIAGNOSIS — M542 Cervicalgia: Secondary | ICD-10-CM

## 2016-09-14 MED ORDER — TRIAMCINOLONE ACETONIDE 40 MG/ML IJ SUSP (RADIOLOGY)
60.0000 mg | Freq: Once | INTRAMUSCULAR | Status: AC
  Administered 2016-09-14: 60 mg via EPIDURAL

## 2016-09-14 MED ORDER — IOPAMIDOL (ISOVUE-M 300) INJECTION 61%
1.0000 mL | Freq: Once | INTRAMUSCULAR | Status: AC | PRN
  Administered 2016-09-14: 1 mL via EPIDURAL

## 2016-09-14 NOTE — Discharge Instructions (Signed)

## 2016-09-20 ENCOUNTER — Ambulatory Visit
Admission: RE | Admit: 2016-09-20 | Discharge: 2016-09-20 | Disposition: A | Discharge status: Home / Self Care | Payer: BLUE CROSS/BLUE SHIELD | Source: Ambulatory Visit | Attending: Obstetrics and Gynecology | Admitting: Obstetrics and Gynecology

## 2016-09-20 DIAGNOSIS — Z1231 Encounter for screening mammogram for malignant neoplasm of breast: Secondary | ICD-10-CM

## 2016-09-28 ENCOUNTER — Other Ambulatory Visit: Payer: Self-pay | Admitting: Neurosurgery

## 2016-10-02 ENCOUNTER — Inpatient Hospital Stay (HOSPITAL_COMMUNITY): Admission: RE | Admit: 2016-10-02 | Payer: BLUE CROSS/BLUE SHIELD | Source: Ambulatory Visit

## 2016-10-03 ENCOUNTER — Encounter (HOSPITAL_COMMUNITY): Admission: RE | Payer: Self-pay | Source: Ambulatory Visit

## 2016-10-03 ENCOUNTER — Ambulatory Visit (HOSPITAL_COMMUNITY): Admission: RE | Admit: 2016-10-03 | Payer: BLUE CROSS/BLUE SHIELD | Source: Ambulatory Visit | Admitting: Neurosurgery

## 2016-10-03 DIAGNOSIS — M5412 Radiculopathy, cervical region: Secondary | ICD-10-CM | POA: Diagnosis not present

## 2016-10-03 DIAGNOSIS — M47892 Other spondylosis, cervical region: Secondary | ICD-10-CM | POA: Diagnosis not present

## 2016-10-03 DIAGNOSIS — M50323 Other cervical disc degeneration at C6-C7 level: Secondary | ICD-10-CM | POA: Diagnosis not present

## 2016-10-03 SURGERY — ANTERIOR CERVICAL DECOMPRESSION/DISCECTOMY FUSION 1 LEVEL
Anesthesia: General

## 2016-10-10 ENCOUNTER — Encounter: Payer: Self-pay | Admitting: Obstetrics and Gynecology

## 2016-10-10 ENCOUNTER — Ambulatory Visit: Payer: Self-pay | Admitting: Obstetrics and Gynecology

## 2016-10-10 ENCOUNTER — Telehealth: Payer: Self-pay | Admitting: Obstetrics and Gynecology

## 2016-10-10 NOTE — Telephone Encounter (Signed)
Left message to call Adriona Kaney at [phone].  

## 2016-10-10 NOTE — Telephone Encounter (Signed)
Patient spouse called for his wife and wants to know if she can have her IUD removed and replaced at her appointment on 11/30/16

## 2016-10-11 NOTE — Telephone Encounter (Signed)
Left message to call Jadien Lehigh at [phone].  

## 2016-10-16 NOTE — Telephone Encounter (Signed)
Left message to call Sharee Pimple at [phone].   Patient needs OV prior to AEX to discuss IUD -last AEX 09/30/14. Mirena IUD inserted 11/09/11

## 2016-10-18 NOTE — Telephone Encounter (Signed)
Left message to call [name] at [phone].  

## 2016-10-18 NOTE — Telephone Encounter (Signed)
Best to have the annual exam first.  Please check and see if we can move up the date for the annual exam.  She should be protected from a pregnancy standpoint, but can use condoms if desired.

## 2016-10-18 NOTE — Telephone Encounter (Signed)
Dr. Quincy Simmonds, returned call x3. Patient has AEX scheduled for 11/30/16. Please advise?

## 2016-10-24 DIAGNOSIS — M542 Cervicalgia: Secondary | ICD-10-CM | POA: Diagnosis not present

## 2016-10-30 NOTE — Telephone Encounter (Signed)
Spoke with patient. Advised as seen below per Dr. Quincy Simmonds. Patient AEX rescheduled for 11/05/16 at 11am with Dr. Quincy Simmonds. Patient verbalizes understanding and is agreeable to date and time.  Routing to provider for final review. Patient is agreeable to disposition. Will close encounter.

## 2016-11-05 ENCOUNTER — Encounter: Payer: Self-pay | Admitting: Obstetrics and Gynecology

## 2016-11-05 ENCOUNTER — Ambulatory Visit: Payer: Self-pay | Admitting: Obstetrics and Gynecology

## 2016-11-13 ENCOUNTER — Telehealth: Payer: Self-pay | Admitting: Obstetrics and Gynecology

## 2016-11-13 DIAGNOSIS — Z975 Presence of (intrauterine) contraceptive device: Secondary | ICD-10-CM

## 2016-11-13 NOTE — Telephone Encounter (Signed)
Patient would like an appointment for iud removal. °

## 2016-11-13 NOTE — Telephone Encounter (Signed)
Patient may make an appointment for IUD removal.  She is overdue for her annual exam, and it looks like she has had 2 DNKAs for annual exams? Needs to keep appointments to continue as a patient.   [place]

## 2016-11-13 NOTE — Telephone Encounter (Signed)
Patient was last seen with Dr.Silva for aex on 09/30/2014. Patient is calling for IUD removal. Mirena IUD was insertion on 11/09/2011 per records. Patient no showed her aex scheduled for 11/05/2016. Routing to Dr.Silva for review regarding appointment scheduling as patient will be a return NGYN.

## 2016-11-14 NOTE — Telephone Encounter (Signed)
Return call to patient. Reviewed need for annual exam as well as office cancellation policy. Patient very concerned regarding missed appointment as she states she cancelled 10-10-16 appointment due to surgery on 10-03-16.  Advised we can reschedule annual and will schedule IUD removal as well. Annual scheduled for 11-23-16. IUD removal scheduled for 11-16-16, patient instructed to take Motrin 800 mg one hour prior with food.   Encounter closed.

## 2016-11-15 DIAGNOSIS — M5412 Radiculopathy, cervical region: Secondary | ICD-10-CM | POA: Diagnosis not present

## 2016-11-16 ENCOUNTER — Ambulatory Visit (INDEPENDENT_AMBULATORY_CARE_PROVIDER_SITE_OTHER): Payer: BLUE CROSS/BLUE SHIELD | Admitting: Obstetrics and Gynecology

## 2016-11-16 ENCOUNTER — Encounter: Payer: Self-pay | Admitting: Obstetrics and Gynecology

## 2016-11-16 VITALS — BP 122/70 | HR 80 | Resp 16 | Ht 67.0 in | Wt 186.0 lb

## 2016-11-16 DIAGNOSIS — Z30432 Encounter for removal of intrauterine contraceptive device: Secondary | ICD-10-CM | POA: Diagnosis not present

## 2016-11-16 DIAGNOSIS — Z3009 Encounter for other general counseling and advice on contraception: Secondary | ICD-10-CM | POA: Diagnosis not present

## 2016-11-16 DIAGNOSIS — Z975 Presence of (intrauterine) contraceptive device: Secondary | ICD-10-CM | POA: Diagnosis not present

## 2016-11-16 MED ORDER — NORETHIN ACE-ETH ESTRAD-FE 1-20 MG-MCG PO TABS: 1.0000 | ORAL_TABLET | Freq: Every day | ORAL | 0 refills | Status: DC

## 2016-11-16 NOTE — Progress Notes (Signed)
GYNECOLOGY  VISIT   HPI: 45 y.o.   Married  Caucasian  female   G2P2002 with No LMP recorded. Patient is not currently having periods (Reason: IUD).   here for Mirena IUD removal.     Wants IUD out but declines a new one.  Took oral contraception in the past.   Not a smoker.   Has migraines without aura.  Just did cervical neck surgery 3 weeks ago. Wearing a neck brace.   UPT negative today.   GYNECOLOGIC HISTORY: No LMP recorded. Patient is not currently having periods (Reason: IUD). Contraception:  Mirena IUD--expired 10/2016 Menopausal hormone therapy:  none Last mammogram:  09/20/16 BIRADS 1 negative Last pap smear:   09/30/14 Pap and HR HPV negative         OB History    Gravida Para Term Preterm AB Living   2 2 2     2    SAB TAB Ectopic Multiple Live Births                     Patient Active Problem List   Diagnosis Date Noted  . Hypothyroidism 09/30/2014    Past Medical History:  Diagnosis Date  . Encounter for insertion of mirena IUD 11/09/2011  . Hyperlipidemia 09/30/14  . Hypothyroidism 09/30/2014  . Thyroid disease     Past Surgical History:  Procedure Laterality Date  . CESAREAN SECTION  x2  . NASAL SEPTUM SURGERY    . NECK SURGERY    . TONSILLECTOMY      Current Outpatient Prescriptions  Medication Sig Dispense Refill  . acyclovir (ZOVIRAX) 400 MG tablet Take 400 mg by mouth 3 (three) times daily as needed (outbreaks).   2  . ibuprofen (ADVIL,MOTRIN) 200 MG tablet Take 400-800 mg by mouth daily as needed for headache or moderate pain.     Marland Kitchen levonorgestrel (MIRENA) 20 MCG/24HR IUD 1 each by Intrauterine route once. Inserted 11/09/11    . levothyroxine (SYNTHROID, LEVOTHROID) 88 MCG tablet Take 88 mcg by mouth daily before breakfast.    . LORazepam (ATIVAN) 0.5 MG tablet Take 0.5 mg by mouth daily as needed for anxiety.   0  . pantoprazole (PROTONIX) 40 MG tablet Take 40 mg by mouth daily as needed (indigestion).      No current  facility-administered medications for this visit.      ALLERGIES: Patient has no known allergies.  Family History  Problem Relation Age of Onset  . Stomach cancer Father   . Breast cancer Sister   . Stomach cancer Paternal Grandmother   . Stomach cancer Paternal Aunt   . Heart attack Mother   . Stroke Mother     Social History   Social History  . Marital status: Married    Spouse name: N/A  . Number of children: N/A  . Years of education: N/A   Occupational History  . Not on file.   Social History Main Topics  . Smoking status: Never Smoker  . Smokeless tobacco: Never Used  . Alcohol use No  . Drug use: No  . Sexual activity: Yes    Partners: Male    Birth control/ protection: IUD   Other Topics Concern  . Not on file   Social History Narrative   Lives with spouse   Works in Press photographer   Caffeine: 2+ servings daily, soda   2 children - 1 boy, 1 girl   College grad    ROS:  Pertinent items are noted  in HPI.  PHYSICAL EXAMINATION:    BP 122/70 (BP Location: Right Arm, Patient Position: Sitting, Cuff Size: Normal)   Pulse 80   Resp 16   Ht 5\' 7"  (1.702 m)   Wt 186 lb (84.4 kg)   BMI 29.13 kg/m     General appearance: alert, cooperative and appears stated age   Pelvic: External genitalia:  no lesions              Urethra:  normal appearing urethra with no masses, tenderness or lesions              Bartholins and Skenes: normal                 Vagina: normal appearing vagina with normal color and discharge, no lesions              Cervix: no lesions.  IUD strings noted.  IUD removed with ring forceps after having verbal permission.  IUD intact, shown to patient, and discarded.                Bimanual Exam:  Uterus:  normal size, contour, position, consistency, mobility, non-tender              Adnexa: no mass, fullness, tenderness          Chaperone was present for exam.  ASSESSMENT  Mirena IUD removal. Initiation of combined oral contraception.     PLAN  Prescription for Loestrin 1/20 3 packs.  Will do annual exam and recheck in 2 months.    An After Visit Summary was printed and given to the patient.  __15____ minutes face to face time of which over 50% was spent in counseling.

## 2016-11-22 DIAGNOSIS — R1110 Vomiting, unspecified: Secondary | ICD-10-CM | POA: Diagnosis not present

## 2016-11-22 DIAGNOSIS — R1013 Epigastric pain: Secondary | ICD-10-CM | POA: Diagnosis not present

## 2016-11-23 ENCOUNTER — Ambulatory Visit: Payer: Self-pay | Admitting: Obstetrics and Gynecology

## 2016-11-30 ENCOUNTER — Ambulatory Visit: Payer: Self-pay | Admitting: Obstetrics and Gynecology

## 2016-12-10 HISTORY — PX: NECK SURGERY: SHX720

## 2016-12-17 DIAGNOSIS — J45909 Unspecified asthma, uncomplicated: Secondary | ICD-10-CM | POA: Diagnosis not present

## 2016-12-17 DIAGNOSIS — E039 Hypothyroidism, unspecified: Secondary | ICD-10-CM | POA: Diagnosis not present

## 2016-12-17 DIAGNOSIS — F411 Generalized anxiety disorder: Secondary | ICD-10-CM | POA: Diagnosis not present

## 2016-12-18 ENCOUNTER — Other Ambulatory Visit: Payer: Self-pay

## 2016-12-18 MED ORDER — NORETHIN ACE-ETH ESTRAD-FE 1-20 MG-MCG PO TABS: 1.0000 | ORAL_TABLET | Freq: Every day | ORAL | 0 refills | Status: DC

## 2016-12-18 NOTE — Telephone Encounter (Signed)
Need a new prescription for [employer] for 90-day supply  Medication refill request: JUNEL FE Last OV:  11/16/16 Encounter for IUD removal Next AEX: none scheduled Last MMG (if hormonal medication request): 09/20/16 BIRADS 1 negative Refill authorized: 11/16/16 #3packs w/0 refill; need new prescription on file.

## 2016-12-31 ENCOUNTER — Encounter: Payer: Self-pay | Admitting: Obstetrics and Gynecology

## 2017-01-01 ENCOUNTER — Telehealth: Payer: Self-pay | Admitting: Obstetrics and Gynecology

## 2017-01-01 DIAGNOSIS — K648 Other hemorrhoids: Secondary | ICD-10-CM | POA: Diagnosis not present

## 2017-01-01 NOTE — Telephone Encounter (Signed)
Patient wants to have IUD insertion

## 2017-01-01 NOTE — Telephone Encounter (Signed)
Spoke with patient. Patient would like to schedule IUD reinsertion. Advised patient per MyChart encounter dated 12/31/2016 Dr.Silva would like her to move her aex forward and have IUD placement scheduled after. Patient is agreeable. Patient is having irregular bleeding since having her IUD removed on 11/16/2016. States she has bleeding daily. Is changing her tampon 6 times per day. Is taking Loestrin OCP. Has not missed any pills or taken any pills late. Advised can move aex forward and may be assessed for irregular bleeding. Advised if pap smear is needed she may have to return for this to have at a later time when she is not bleeding. Patient is agreeable. Aware after assessment tomorrow determination for IUD insertion will be made. Patient is agreeable.  Routing to provider for final review. Patient agreeable to disposition. Will close encounter.

## 2017-01-02 ENCOUNTER — Encounter: Payer: Self-pay | Admitting: Obstetrics and Gynecology

## 2017-01-02 ENCOUNTER — Ambulatory Visit (INDEPENDENT_AMBULATORY_CARE_PROVIDER_SITE_OTHER): Payer: BLUE CROSS/BLUE SHIELD | Admitting: Obstetrics and Gynecology

## 2017-01-02 VITALS — BP 104/64 | HR 80 | Resp 18 | Ht 67.0 in | Wt 181.4 lb

## 2017-01-02 DIAGNOSIS — Z01419 Encounter for gynecological examination (general) (routine) without abnormal findings: Secondary | ICD-10-CM

## 2017-01-02 DIAGNOSIS — Z0000 Encounter for general adult medical examination without abnormal findings: Secondary | ICD-10-CM | POA: Diagnosis not present

## 2017-01-02 DIAGNOSIS — Z803 Family history of malignant neoplasm of breast: Secondary | ICD-10-CM | POA: Diagnosis not present

## 2017-01-02 DIAGNOSIS — N921 Excessive and frequent menstruation with irregular cycle: Secondary | ICD-10-CM | POA: Diagnosis not present

## 2017-01-02 DIAGNOSIS — Z800 Family history of malignant neoplasm of digestive organs: Secondary | ICD-10-CM | POA: Diagnosis not present

## 2017-01-02 LAB — POCT URINE PREGNANCY: PREG TEST UR: NEGATIVE

## 2017-01-02 MED ORDER — NORETHIN ACE-ETH ESTRAD-FE 1-20 MG-MCG PO TABS: 1.0000 | ORAL_TABLET | Freq: Every day | ORAL | 0 refills | Status: DC

## 2017-01-02 NOTE — Patient Instructions (Signed)

## 2017-01-02 NOTE — Progress Notes (Signed)
46 y.o. G78P2002 Married Caucasian female here for annual exam.    Had IUD removed on 11/16/16.  No bleeding prior to this.  Started OCPs one week later and has bleeding daily.  Taking LoEstrin 1/20.  Changing pad 6 - 8 times per day.  Some cramping tx with Advil. No missed pills.   Had neck surgery.  Now is normalizing activity.   FH of breast cancer in 2 sisters.  Older sister also had cancer in the colon.  No family history of ovarian or uterine cancer.   PCP:   Dr. Bishop [place].  Patient's last menstrual period was 12/11/2016 (exact date).     Period Cycle (Days):  (bleeding almost daily with OCPs)     Sexually active: Yes.    The current method of family planning is OCP (estrogen/progesterone).    Exercising: No.    Smoker:  no  Health Maintenance: Pap:  09/30/14 - Neg, neg HR HPV. History of abnormal Pap:  no MMG:  09/24/16 - Bi-RADS 1. Colonoscopy:  NA BMD:   NA TDaP:  02/2011.  Screening Labs:   PCP.   reports that she has never smoked. She has never used smokeless tobacco. She reports that she does not drink alcohol or use drugs.  Past Medical History:  Diagnosis Date  . Encounter for insertion of mirena IUD 11/09/2011  . History of migraine headaches    no aura  . Hyperlipidemia 09/30/14  . Hypothyroidism 09/30/2014  . Thyroid disease     Past Surgical History:  Procedure Laterality Date  . CESAREAN SECTION  x2  . NASAL SEPTUM SURGERY    . NECK SURGERY    . TONSILLECTOMY      Current Outpatient Prescriptions  Medication Sig Dispense Refill  . acyclovir (ZOVIRAX) 400 MG tablet Take 400 mg by mouth 3 (three) times daily as needed (outbreaks).   2  . ibuprofen (ADVIL,MOTRIN) 200 MG tablet Take 400-800 mg by mouth daily as needed for headache or moderate pain.     Marland Kitchen levothyroxine (SYNTHROID, LEVOTHROID) 88 MCG tablet Take 88 mcg by mouth daily before breakfast.    . LORazepam (ATIVAN) 0.5 MG tablet Take 0.5 mg by mouth daily as needed for anxiety.   0   . norethindrone-ethinyl estradiol (JUNEL FE,GILDESS FE,LOESTRIN FE) 1-20 MG-MCG tablet Take 1 tablet by mouth daily. 3 Package 0  . pantoprazole (PROTONIX) 40 MG tablet Take 40 mg by mouth daily as needed (indigestion).      No current facility-administered medications for this visit.     Family History  Problem Relation Age of Onset  . Stomach cancer Father   . Breast cancer Sister   . Stomach cancer Paternal Grandmother   . Stomach cancer Paternal Aunt   . Heart attack Mother   . Stroke Mother     ROS:  Pertinent items are noted in HPI.  Otherwise, a comprehensive ROS was negative.  Exam:   BP 104/64 (BP Location: Right Arm, Patient Position: Sitting, Cuff Size: Normal)   Pulse 80   Resp 18   Ht 5\' 7"  (1.702 m)   Wt 181 lb 6.4 oz (82.3 kg)   LMP 12/11/2016 (Exact Date)   BMI 28.41 kg/m     General appearance: alert, cooperative and appears stated age Head: Normocephalic, without obvious abnormality, atraumatic Neck: no adenopathy, supple, symmetrical, trachea midline and thyroid normal to inspection and palpation Lungs: clear to auscultation bilaterally Breasts: normal appearance, no masses or tenderness, No nipple retraction or  dimpling, No nipple discharge or bleeding, No axillary or supraclavicular adenopathy Heart: regular rate and rhythm Abdomen: soft, non-tender; no masses, no organomegaly Extremities: extremities normal, atraumatic, no cyanosis or edema Skin: Skin color, texture, turgor normal. No rashes or lesions Lymph nodes: Cervical, supraclavicular, and axillary nodes normal. No abnormal inguinal nodes palpated Neurologic: Grossly normal  Pelvic: External genitalia:  no lesions              Urethra:  normal appearing urethra with no masses, tenderness or lesions              Bartholins and Skenes: normal                 Vagina: normal appearing vagina with normal color and discharge, no lesions              Cervix: no lesions              Pap taken: No.   Unable to due because of bleeding.  Bimanual Exam:  Uterus: 7 week size and irregular, contour, position, consistency, mobility, non-tender              Adnexa: no mass, fullness, tenderness              Rectal exam: Yes.  .  Confirms.              Anus:  normal sphincter tone, no lesions  Chaperone was present for exam.  Assessment:   Well woman visit with normal exam. FH breast cancer in 2 sisters, one in her 22s and one in her 71s.  FH of colon cancer. Status post Mirena IUD removal. metorrhagia on low dose OCPs.  I suspect some fibroids. Hyperlipidemia.   Plan: Mammogram screening discussed. Recommended self breast awareness. Pap and HR HPV as above. Guidelines for Calcium, Vitamin D, regular exercise program including cardiovascular and weight bearing exercise. Referral to genetics for counseling and testing. Return for sonohysterogram and possible EMB. Will continue low dose OCPs and double up for the next 5 days to try and stop the bleeding.  UPT now - neg.  Follow up annually and prn.         After visit summary provided.

## 2017-01-02 NOTE — Progress Notes (Signed)
Opened in error

## 2017-01-03 ENCOUNTER — Telehealth: Payer: Self-pay | Admitting: Obstetrics and Gynecology

## 2017-01-03 NOTE — Telephone Encounter (Signed)
Dr. Quincy Simmonds -per [place] dated 01/02/17 patient to double up on OCP for 5 days to stop bleeding. If bleeding has stopped, ok to keep [facility] appointment as seen below? Please advise?

## 2017-01-03 NOTE — Telephone Encounter (Signed)
Please keep appointment for evaluation of abnormal uterine bleeding on 01/10/17.  OK to proceed with sonohysterogram/EMB appointment.

## 2017-01-03 NOTE — Telephone Encounter (Signed)
I have spoken with this patient and scheduled a sonohysterogram and possible endometrial on 01/10/17.  Patients last period, however,  was on 12/11/16.  When discussing we should schedule around days seven through ten of her cycle, patient states she "bleeds all the time" and would like to leave appointment 01/10/17, if possible. Advised patient I will forward to our triage team to verify if the timeframe of the scheduled appointment is optimal. Patient is agreeable to a return call from the triage team.  Routing to Triage

## 2017-01-04 NOTE — Telephone Encounter (Signed)
Spoke with patient, advised as seen below per Dr. Quincy Simmonds. Patient to keep 01/10/17 0900 appointment. Patient verbalizes understanding and is agreeable.  Routing to provider for final review. Patient is agreeable to disposition. Will close encounter.

## 2017-01-09 ENCOUNTER — Encounter: Payer: Self-pay | Admitting: Obstetrics and Gynecology

## 2017-01-10 ENCOUNTER — Encounter: Payer: Self-pay | Admitting: Obstetrics and Gynecology

## 2017-01-10 ENCOUNTER — Ambulatory Visit (INDEPENDENT_AMBULATORY_CARE_PROVIDER_SITE_OTHER): Payer: BLUE CROSS/BLUE SHIELD

## 2017-01-10 ENCOUNTER — Ambulatory Visit (INDEPENDENT_AMBULATORY_CARE_PROVIDER_SITE_OTHER): Payer: BLUE CROSS/BLUE SHIELD | Admitting: Obstetrics and Gynecology

## 2017-01-10 ENCOUNTER — Other Ambulatory Visit: Payer: Self-pay | Admitting: Obstetrics and Gynecology

## 2017-01-10 VITALS — BP 126/82 | HR 70 | Ht 67.0 in | Wt 181.0 lb

## 2017-01-10 DIAGNOSIS — N921 Excessive and frequent menstruation with irregular cycle: Secondary | ICD-10-CM | POA: Diagnosis not present

## 2017-01-10 DIAGNOSIS — E039 Hypothyroidism, unspecified: Secondary | ICD-10-CM

## 2017-01-10 LAB — CBC
HCT: 42.8 % (ref 35.0–45.0)
Hemoglobin: 14 g/dL (ref 11.7–15.5)
MCH: 29.3 pg (ref 27.0–33.0)
MCHC: 32.7 g/dL (ref 32.0–36.0)
MCV: 89.5 fL (ref 80.0–100.0)
MPV: 10.1 fL (ref 7.5–12.5)
PLATELETS: 312 10*3/uL (ref 140–400)
RBC: 4.78 MIL/uL (ref 3.80–5.10)
RDW: 13.1 % (ref 11.0–15.0)
WBC: 6.3 10*3/uL (ref 3.8–10.8)

## 2017-01-10 MED ORDER — NORETHIN ACE-ETH ESTRAD-FE 1-20 MG-MCG PO TABS: 1.0000 | ORAL_TABLET | Freq: Every day | ORAL | 0 refills | Status: DC

## 2017-01-10 NOTE — Progress Notes (Signed)
Patient ID: Brianna [name], female   DOB: 1971-08-07, 46 y.o.   MRN: [id number] GYNECOLOGY  VISIT   HPI: 46 y.o.   Married  Caucasian  female   G2P2002 with Patient's last menstrual period was 12/11/2016 (exact date).   here for sonohysterogram for metorrhagia.   Bleeding since 12/11/16. Mirena IUD removed on 11/16/16. Interested in a new Mirena IUD. Doubled up on her OCPs for the last 5 days and did not help.  Neg UPT on 01/02/17. Not sexually active since last office visit on 01/02/17.  Had thyroid evaluated last year with PCP.  Dr. Aron Baba.  No change in her Synthroid dose.  FH of breast cancer in 2 sisters.  Older sister also had cancer in the colon.  No family history of ovarian or uterine cancer.   GYNECOLOGIC HISTORY: Patient's last menstrual period was 12/11/2016 (exact date). Contraception:  OCPs--Junel Menopausal hormone therapy:  n/a Last mammogram:  09-20-16 Density B/Neg/BiRads1:TBC Last pap smear:   09-30-14 Neg:Neg HR HPV        OB History    Gravida Para Term Preterm AB Living   2 2 2     2    SAB TAB Ectopic Multiple Live Births                     Patient Active Problem List   Diagnosis Date Noted  . Hypothyroidism 09/30/2014    Past Medical History:  Diagnosis Date  . Encounter for insertion of mirena IUD 11/09/2011  . History of migraine headaches    no aura  . Hyperlipidemia 09/30/14  . Hypothyroidism 09/30/2014  . Thyroid disease     Past Surgical History:  Procedure Laterality Date  . CESAREAN SECTION  x2  . NASAL SEPTUM SURGERY    . NECK SURGERY    . TONSILLECTOMY      Current Outpatient Prescriptions  Medication Sig Dispense Refill  . acyclovir (ZOVIRAX) 400 MG tablet Take 400 mg by mouth 3 (three) times daily as needed (outbreaks).   2  . ibuprofen (ADVIL,MOTRIN) 200 MG tablet Take 400-800 mg by mouth daily as needed for headache or moderate pain.     Marland Kitchen levothyroxine (SYNTHROID, LEVOTHROID) 88 MCG tablet Take 88 mcg by mouth daily  before breakfast.    . LORazepam (ATIVAN) 0.5 MG tablet Take 0.5 mg by mouth daily as needed for anxiety.   0  . norethindrone-ethinyl estradiol (JUNEL FE,GILDESS FE,LOESTRIN FE) 1-20 MG-MCG tablet Take 1 tablet by mouth daily. 1 Package 0  . pantoprazole (PROTONIX) 40 MG tablet Take 40 mg by mouth daily as needed (indigestion).      No current facility-administered medications for this visit.      ALLERGIES: Patient has no known allergies.  Family History  Problem Relation Age of Onset  . Stomach cancer Father   . Breast cancer Sister   . Stomach cancer Paternal Grandmother   . Stomach cancer Paternal Aunt   . Heart attack Mother   . Stroke Mother     Social History   Social History  . Marital status: Married    Spouse name: N/A  . Number of children: N/A  . Years of education: N/A   Occupational History  . Not on file.   Social History Main Topics  . Smoking status: Never Smoker  . Smokeless tobacco: Never Used  . Alcohol use No  . Drug use: No  . Sexual activity: Yes    Partners: Male    Birth  control/ protection: OCP     Comment: June 1/20   Other Topics Concern  . Not on file   Social History Narrative   Lives with spouse   Works in Press photographer   Caffeine: 2+ servings daily, soda   2 children - 1 boy, 1 girl   College grad    ROS:  Pertinent items are noted in HPI.  PHYSICAL EXAMINATION:    BP 126/82 (BP Location: Right Arm, Patient Position: Sitting, Cuff Size: Normal)   Pulse 70   Ht 5\' 7"  (1.702 m)   Wt 181 lb (82.1 kg)   LMP 12/11/2016 (Exact Date)   BMI 28.35 kg/m     General appearance: alert, cooperative and appears stated age   Technique:  Both transabdominal and transvaginal ultrasound examinations of the pelvis were performed. Transabdominal technique was performed for global imaging of the pelvis including uterus, ovaries, adnexal regions, and pelvic cul-de-sac. It was necessary to proceed with endovaginal exam following the abdominal  ultrasound.  Transabdominal exam to visualize the endometrium and adnexa.  Color and duplex Doppler ultrasound was utilized to evaluate blood flow to the ovaries.   Pelvic ultrasound No myometrial masses.  EMS - 15.68 mm Ovaries normal. Bilateral ovarian follicles. No free fluid.  Procedure - sonohysterogram Consent performed. Speculum placed in vagina. Sterile prep of cervix with Hibiclens. Cannula placed inside endometrial cavity without difficulty. Speculum removed. Sterile saline injected.      No        filling defect noted.  Cesarean Section scars noted. Cannula removed. No complication.   Procedure - endometrial biopsy Consent performed. Speculum place in vagina.  Sterile prep of cervix with Hibiclens Tenaculum to anterior cervical lip.   Pipelle placed to     Almost 9     cm without difficulty twice. Tissue obtained and sent to pathology. Speculum removed.  No complications. Minimal EBL.   ASSESSMENT  Metrorrhagia.  Status post removal of Mirena IUD. On combined oral contraception. FH of breast and colon cancer.  Hypothyroidism.   PLAN  Follow up EMB.  Instructions and precautions given.  Plan for Mirena IUD placement if EMB result supports this.  Check CBC and TFTs today. Has been referred to [facility] specialist.  An After Visit Summary was printed and given to the patient.  __15____ minutes face to face time of which over 50% was spent in counseling.

## 2017-01-10 NOTE — Patient Instructions (Signed)

## 2017-01-10 NOTE — Progress Notes (Signed)
Encounter reviewed by Dr. Brook Amundson C. Silva.  

## 2017-01-11 LAB — THYROID PANEL WITH TSH
FREE THYROXINE INDEX: 3.2 (ref 1.4–3.8)
T3 UPTAKE: 22 % (ref 22–35)
T4, Total: 14.7 ug/dL — ABNORMAL HIGH (ref 4.5–12.0)
TSH: 2.82 m[IU]/L

## 2017-01-14 LAB — IPS OTHER TISSUE BIOPSY

## 2017-01-15 ENCOUNTER — Other Ambulatory Visit: Payer: Self-pay | Admitting: *Deleted

## 2017-01-15 DIAGNOSIS — Z30430 Encounter for insertion of intrauterine contraceptive device: Secondary | ICD-10-CM

## 2017-01-15 MED ORDER — MISOPROSTOL 200 MCG PO TABS: ORAL_TABLET | ORAL | 0 refills | Status: DC

## 2017-01-24 ENCOUNTER — Ambulatory Visit (INDEPENDENT_AMBULATORY_CARE_PROVIDER_SITE_OTHER): Payer: BLUE CROSS/BLUE SHIELD | Admitting: Obstetrics and Gynecology

## 2017-01-24 ENCOUNTER — Encounter: Payer: Self-pay | Admitting: Obstetrics and Gynecology

## 2017-01-24 VITALS — BP 116/70 | HR 76 | Ht 67.0 in | Wt 184.0 lb

## 2017-01-24 DIAGNOSIS — Z01812 Encounter for preprocedural laboratory examination: Secondary | ICD-10-CM | POA: Diagnosis not present

## 2017-01-24 DIAGNOSIS — Z30430 Encounter for insertion of intrauterine contraceptive device: Secondary | ICD-10-CM

## 2017-01-24 LAB — POCT URINE PREGNANCY: Preg Test, Ur: NEGATIVE

## 2017-01-24 NOTE — Patient Instructions (Signed)

## 2017-01-24 NOTE — Progress Notes (Signed)
GYNECOLOGY  VISIT   HPI: 46 y.o.   Married  Caucasian  female   G2P2002 with Patient's last menstrual period was 01/17/2017 (exact date).   here for Mirena IUD insertion.     Still on her OCPs. Bleeding finally stopped by taking two per day.   Patient took 800mg  of Ibuprofen at home prior to office visit. She states she did place Cytotec in vagina last PM and this AM.  UPT: Negative  GYNECOLOGIC HISTORY: Patient's last menstrual period was 01/17/2017 (exact date). Contraception:  OCPs--Junel Menopausal hormone therapy:  n/a Last mammogram: 09-20-16 Density B/Neg/BiRads1:TBC  Last pap smear: 09-30-14 Neg:Neg HR HPV         OB History    Gravida Para Term Preterm AB Living   2 2 2     2    SAB TAB Ectopic Multiple Live Births                     Patient Active Problem List   Diagnosis Date Noted  . Hypothyroidism 09/30/2014    Past Medical History:  Diagnosis Date  . Encounter for insertion of mirena IUD 11/09/2011  . History of migraine headaches    no aura  . Hyperlipidemia 09/30/14  . Hypothyroidism 09/30/2014  . Thyroid disease     Past Surgical History:  Procedure Laterality Date  . CESAREAN SECTION  x2  . NASAL SEPTUM SURGERY    . NECK SURGERY    . TONSILLECTOMY      Current Outpatient Prescriptions  Medication Sig Dispense Refill  . acyclovir (ZOVIRAX) 400 MG tablet Take 400 mg by mouth 3 (three) times daily as needed (outbreaks).   2  . ibuprofen (ADVIL,MOTRIN) 200 MG tablet Take 400-800 mg by mouth daily as needed for headache or moderate pain.     Marland Kitchen levothyroxine (SYNTHROID, LEVOTHROID) 88 MCG tablet Take 88 mcg by mouth daily before breakfast.    . LORazepam (ATIVAN) 0.5 MG tablet Take 0.5 mg by mouth daily as needed for anxiety.   0  . misoprostol (CYTOTEC) 200 MCG tablet Place one tablet vaginally the night before procedure and place one tablet vaginally morning of procedure. 2 tablet 0  . norethindrone-ethinyl estradiol (JUNEL FE,GILDESS  FE,LOESTRIN FE) 1-20 MG-MCG tablet Take 1 tablet by mouth daily. 1 Package 0  . pantoprazole (PROTONIX) 40 MG tablet Take 40 mg by mouth daily as needed (indigestion).      No current facility-administered medications for this visit.      ALLERGIES: Patient has no known allergies.  Family History  Problem Relation Age of Onset  . Stomach cancer Father   . Breast cancer Sister   . Stomach cancer Paternal Grandmother   . Stomach cancer Paternal Aunt   . Heart attack Mother   . Stroke Mother     Social History   Social History  . Marital status: Married    Spouse name: N/A  . Number of children: N/A  . Years of education: N/A   Occupational History  . Not on file.   Social History Main Topics  . Smoking status: Never Smoker  . Smokeless tobacco: Never Used  . Alcohol use No  . Drug use: No  . Sexual activity: Yes    Partners: Male    Birth control/ protection: OCP     Comment: June 1/20   Other Topics Concern  . Not on file   Social History Narrative   Lives with spouse   Works in  accounting   Caffeine: 2+ servings daily, soda   2 children - 1 boy, 1 girl   College grad    ROS:  Pertinent items are noted in HPI.  PHYSICAL EXAMINATION:    BP 116/70 (BP Location: Right Arm, Patient Position: Sitting, Cuff Size: Normal)   Pulse 76   Ht 5\' 7"  (1.702 m)   Wt 184 lb (83.5 kg)   LMP 01/17/2017 (Exact Date)   BMI 28.82 kg/m     General appearance: alert, cooperative and appears stated age   Pelvic: External genitalia:  no lesions              Urethra:  normal appearing urethra with no masses, tenderness or lesions              Bartholins and Skenes: normal                 Vagina: normal appearing vagina with normal color and discharge, no lesions              Cervix: no lesions                Bimanual Exam:  Uterus:  normal size, contour, position, consistency, mobility, non-tender              Adnexa: no mass, fullness, tenderness       Mirena IUD  insertion. Mirena IUD lot [id number], exp 06/20.  Consent for procedure. Speculum placed in vagina. Mild amount of blood noted.  Hibiclens prep to cervix.  Paracervical block with 10 cc local 1% lidocaine [id number], exp 02/21. Tenaculum to anterior cervical lip.  Uterus sounded to 8 cm.  Mirena placed without difficulty. Strings trimmed.  Speculum removed.  No complications.  Minimal EBL.  Repeat speculum exam, no change.  Chaperone was present for exam.  ASSESSMENT  Mirena IUD placement.   PLAN  Instructions and precautions given.  Mirena IUD insertion card and instruction booklet to patient.  Follow up in 4 weeks, sooner as needed.   An After Visit Summary was printed and given to the patient.

## 2017-01-28 ENCOUNTER — Ambulatory Visit: Payer: Self-pay | Admitting: Obstetrics and Gynecology

## 2017-02-06 ENCOUNTER — Encounter: Payer: Self-pay | Admitting: Obstetrics and Gynecology

## 2017-02-06 NOTE — Telephone Encounter (Signed)
Patient requesting letter for daughter via MyChart message, will verify dpr and place call to patient. Will close this encounter.

## 2017-02-19 ENCOUNTER — Encounter: Payer: Self-pay | Admitting: Obstetrics and Gynecology

## 2017-02-20 ENCOUNTER — Telehealth: Payer: Self-pay | Admitting: *Deleted

## 2017-02-20 ENCOUNTER — Encounter: Payer: Self-pay | Admitting: Obstetrics and Gynecology

## 2017-02-20 NOTE — Telephone Encounter (Signed)
Forwarded message to nursing supervisor for scheduling.

## 2017-02-20 NOTE — Telephone Encounter (Signed)
Opened in error, will close encounter

## 2017-02-21 NOTE — Telephone Encounter (Signed)
See telephone encounter created for Brianna [name]. Will close this encounter.

## 2017-07-15 DIAGNOSIS — J45909 Unspecified asthma, uncomplicated: Secondary | ICD-10-CM | POA: Diagnosis not present

## 2017-07-15 DIAGNOSIS — E039 Hypothyroidism, unspecified: Secondary | ICD-10-CM | POA: Diagnosis not present

## 2017-07-15 DIAGNOSIS — K219 Gastro-esophageal reflux disease without esophagitis: Secondary | ICD-10-CM | POA: Diagnosis not present

## 2017-07-15 DIAGNOSIS — F411 Generalized anxiety disorder: Secondary | ICD-10-CM | POA: Diagnosis not present

## 2018-04-28 DIAGNOSIS — K219 Gastro-esophageal reflux disease without esophagitis: Secondary | ICD-10-CM | POA: Diagnosis not present

## 2018-04-28 DIAGNOSIS — E039 Hypothyroidism, unspecified: Secondary | ICD-10-CM | POA: Diagnosis not present

## 2018-04-28 DIAGNOSIS — F411 Generalized anxiety disorder: Secondary | ICD-10-CM | POA: Diagnosis not present

## 2018-09-23 DIAGNOSIS — R21 Rash and other nonspecific skin eruption: Secondary | ICD-10-CM | POA: Diagnosis not present

## 2018-09-25 ENCOUNTER — Other Ambulatory Visit: Payer: Self-pay | Admitting: Physician Assistant

## 2018-09-25 DIAGNOSIS — L539 Erythematous condition, unspecified: Secondary | ICD-10-CM

## 2018-09-25 DIAGNOSIS — R21 Rash and other nonspecific skin eruption: Secondary | ICD-10-CM

## 2018-10-01 ENCOUNTER — Ambulatory Visit
Admission: RE | Admit: 2018-10-01 | Discharge: 2018-10-01 | Disposition: A | Discharge status: Home / Self Care | Payer: BLUE CROSS/BLUE SHIELD | Source: Ambulatory Visit | Attending: Physician Assistant | Admitting: Physician Assistant

## 2018-10-01 DIAGNOSIS — L539 Erythematous condition, unspecified: Secondary | ICD-10-CM

## 2018-10-01 DIAGNOSIS — R21 Rash and other nonspecific skin eruption: Secondary | ICD-10-CM | POA: Diagnosis not present

## 2018-10-09 DIAGNOSIS — R739 Hyperglycemia, unspecified: Secondary | ICD-10-CM | POA: Diagnosis not present

## 2018-10-09 DIAGNOSIS — K219 Gastro-esophageal reflux disease without esophagitis: Secondary | ICD-10-CM | POA: Diagnosis not present

## 2018-10-09 DIAGNOSIS — Z0000 Encounter for general adult medical examination without abnormal findings: Secondary | ICD-10-CM | POA: Diagnosis not present

## 2018-10-09 DIAGNOSIS — R21 Rash and other nonspecific skin eruption: Secondary | ICD-10-CM | POA: Diagnosis not present

## 2018-10-09 DIAGNOSIS — E039 Hypothyroidism, unspecified: Secondary | ICD-10-CM | POA: Diagnosis not present

## 2018-10-09 DIAGNOSIS — F411 Generalized anxiety disorder: Secondary | ICD-10-CM | POA: Diagnosis not present

## 2018-10-09 DIAGNOSIS — Z13220 Encounter for screening for lipoid disorders: Secondary | ICD-10-CM | POA: Diagnosis not present

## 2019-06-15 DIAGNOSIS — L309 Dermatitis, unspecified: Secondary | ICD-10-CM | POA: Diagnosis not present

## 2019-06-15 DIAGNOSIS — E039 Hypothyroidism, unspecified: Secondary | ICD-10-CM | POA: Diagnosis not present

## 2019-07-05 DIAGNOSIS — Z20828 Contact with and (suspected) exposure to other viral communicable diseases: Secondary | ICD-10-CM | POA: Diagnosis not present

## 2019-07-28 DIAGNOSIS — E039 Hypothyroidism, unspecified: Secondary | ICD-10-CM | POA: Diagnosis not present

## 2020-01-07 DIAGNOSIS — E039 Hypothyroidism, unspecified: Secondary | ICD-10-CM | POA: Diagnosis not present

## 2020-03-16 DIAGNOSIS — M25562 Pain in left knee: Secondary | ICD-10-CM | POA: Diagnosis not present

## 2020-07-22 DIAGNOSIS — F321 Major depressive disorder, single episode, moderate: Secondary | ICD-10-CM | POA: Diagnosis not present

## 2020-07-22 DIAGNOSIS — E039 Hypothyroidism, unspecified: Secondary | ICD-10-CM | POA: Diagnosis not present

## 2020-08-14 DIAGNOSIS — Z20822 Contact with and (suspected) exposure to covid-19: Secondary | ICD-10-CM | POA: Diagnosis not present

## 2020-08-15 DIAGNOSIS — Z20822 Contact with and (suspected) exposure to covid-19: Secondary | ICD-10-CM | POA: Diagnosis not present

## 2020-09-19 DIAGNOSIS — Z20822 Contact with and (suspected) exposure to covid-19: Secondary | ICD-10-CM | POA: Diagnosis not present

## 2020-11-28 DIAGNOSIS — F411 Generalized anxiety disorder: Secondary | ICD-10-CM | POA: Diagnosis not present

## 2020-11-28 DIAGNOSIS — F321 Major depressive disorder, single episode, moderate: Secondary | ICD-10-CM | POA: Diagnosis not present

## 2020-12-10 DIAGNOSIS — U071 COVID-19: Secondary | ICD-10-CM

## 2020-12-10 HISTORY — DX: COVID-19: U07.1

## 2021-02-08 DIAGNOSIS — Z111 Encounter for screening for respiratory tuberculosis: Secondary | ICD-10-CM | POA: Diagnosis not present

## 2021-02-08 DIAGNOSIS — Z029 Encounter for administrative examinations, unspecified: Secondary | ICD-10-CM | POA: Diagnosis not present

## 2021-02-08 DIAGNOSIS — Z23 Encounter for immunization: Secondary | ICD-10-CM | POA: Diagnosis not present

## 2021-03-17 DIAGNOSIS — M47816 Spondylosis without myelopathy or radiculopathy, lumbar region: Secondary | ICD-10-CM | POA: Diagnosis not present

## 2021-03-17 DIAGNOSIS — M7918 Myalgia, other site: Secondary | ICD-10-CM | POA: Diagnosis not present

## 2021-03-31 DIAGNOSIS — Z23 Encounter for immunization: Secondary | ICD-10-CM | POA: Diagnosis not present

## 2021-05-30 DIAGNOSIS — S61202A Unspecified open wound of right middle finger without damage to nail, initial encounter: Secondary | ICD-10-CM | POA: Diagnosis not present

## 2021-10-03 DIAGNOSIS — E039 Hypothyroidism, unspecified: Secondary | ICD-10-CM | POA: Diagnosis not present

## 2021-10-17 ENCOUNTER — Telehealth: Payer: Self-pay | Admitting: Obstetrics and Gynecology

## 2021-10-17 ENCOUNTER — Other Ambulatory Visit: Payer: Self-pay | Admitting: Obstetrics and Gynecology

## 2021-10-17 DIAGNOSIS — Z1231 Encounter for screening mammogram for malignant neoplasm of breast: Secondary | ICD-10-CM

## 2021-10-17 NOTE — Telephone Encounter (Signed)
Please contact patient to schedule an annual exam with me.  She has not been seen since 2018.   I just received an order to sign for her mammogram.

## 2021-10-21 ENCOUNTER — Ambulatory Visit
Admission: RE | Admit: 2021-10-21 | Discharge: 2021-10-21 | Disposition: A | Discharge status: Home / Self Care | Payer: BC Managed Care – PPO | Source: Ambulatory Visit

## 2021-10-21 DIAGNOSIS — Z1231 Encounter for screening mammogram for malignant neoplasm of breast: Secondary | ICD-10-CM | POA: Diagnosis not present

## 2021-10-23 DIAGNOSIS — Z23 Encounter for immunization: Secondary | ICD-10-CM | POA: Diagnosis not present

## 2021-12-10 DIAGNOSIS — N83209 Unspecified ovarian cyst, unspecified side: Secondary | ICD-10-CM

## 2021-12-10 HISTORY — DX: Unspecified ovarian cyst, unspecified side: N83.209

## 2022-02-05 NOTE — Telephone Encounter (Signed)
Thurnell Garbe A, CMA  [place], Kimalexis, RMA 3 months ago   Patient did not return call to schedule appt. Several messages were left.   Routing to Dr. Antony Blackbird.

## 2022-04-17 DIAGNOSIS — R0683 Snoring: Secondary | ICD-10-CM | POA: Diagnosis not present

## 2022-04-17 DIAGNOSIS — E039 Hypothyroidism, unspecified: Secondary | ICD-10-CM | POA: Diagnosis not present

## 2022-04-17 DIAGNOSIS — Z131 Encounter for screening for diabetes mellitus: Secondary | ICD-10-CM | POA: Diagnosis not present

## 2022-04-17 DIAGNOSIS — Z13220 Encounter for screening for lipoid disorders: Secondary | ICD-10-CM | POA: Diagnosis not present

## 2022-04-17 DIAGNOSIS — Z0000 Encounter for general adult medical examination without abnormal findings: Secondary | ICD-10-CM | POA: Diagnosis not present

## 2022-04-19 DIAGNOSIS — E039 Hypothyroidism, unspecified: Secondary | ICD-10-CM | POA: Diagnosis not present

## 2022-04-19 DIAGNOSIS — E785 Hyperlipidemia, unspecified: Secondary | ICD-10-CM | POA: Diagnosis not present

## 2022-05-09 DIAGNOSIS — G4719 Other hypersomnia: Secondary | ICD-10-CM | POA: Diagnosis not present

## 2022-07-03 DIAGNOSIS — G4733 Obstructive sleep apnea (adult) (pediatric): Secondary | ICD-10-CM | POA: Diagnosis not present

## 2022-07-03 DIAGNOSIS — E039 Hypothyroidism, unspecified: Secondary | ICD-10-CM | POA: Diagnosis not present

## 2022-07-09 DIAGNOSIS — G4733 Obstructive sleep apnea (adult) (pediatric): Secondary | ICD-10-CM | POA: Diagnosis not present

## 2022-07-17 NOTE — Progress Notes (Signed)
51 y.o. G87P2002 Married Caucasian female here for annual exam.    Occasional vaginal bleeding every 20 days.  No pain.   Patient previous patient of Dr.Silva's.  PCP: [facility]    No LMP recorded. (Menstrual status: IUD).     Period Cycle (Days):  (only occ. spotting with Mirena IUD)     Sexually active: Yes.    The current method of family planning is IUD--Mirena 01/17/17. Exercising: No.   Some walking Smoker:  no  Health Maintenance: Pap: 09-30-14 Neg:Neg HR HPV  History of abnormal Pap:  no MMG:  10-21-21 Neg/BiRads1 Colonoscopy:  Never.  PCP is referring.  BMD: 04/2022  Result :Normal per patient TDaP:  02-08-21  Td Gardasil:   no HIV:no Hep C:no Screening Labs:  PCP   reports that she has never smoked. She has never used smokeless tobacco. She reports that she does not drink alcohol and does not use drugs.  Past Medical History:  Diagnosis Date   Encounter for insertion of mirena IUD 11/09/2011   History of migraine headaches    no aura   Hyperlipidemia 09/30/14   Hypothyroidism 09/30/2014   Thyroid disease     Past Surgical History:  Procedure Laterality Date   CESAREAN SECTION  x2   NASAL SEPTUM SURGERY     NECK SURGERY     TONSILLECTOMY      Current Outpatient Medications  Medication Sig Dispense Refill   acyclovir (ZOVIRAX) 400 MG tablet Take 400 mg by mouth 3 (three) times daily as needed (outbreaks).   2   FLUoxetine (PROZAC) 10 MG capsule Take by mouth.     ibuprofen (ADVIL,MOTRIN) 200 MG tablet Take 400-800 mg by mouth daily as needed for headache or moderate pain.      LORazepam (ATIVAN) 0.5 MG tablet Take 0.5 mg by mouth daily as needed for anxiety.   0   pantoprazole (PROTONIX) 40 MG tablet Take 40 mg by mouth daily as needed (indigestion).      rosuvastatin (CRESTOR) 20 MG tablet Take 20 mg by mouth at bedtime.     SYNTHROID 112 MCG tablet Take by mouth.     triamcinolone cream (KENALOG) 0.1 % APPLY 1 APPLICATION EXTERNALLY TO  THE AFFECTED AREA TWICE A DAY     No current facility-administered medications for this visit.    Family History  Problem Relation Age of Onset   Heart attack Mother    Stroke Mother    Stomach cancer Father    Cancer Sister        breast, 76 yo and colon cancer, 51 yo   Breast cancer Sister    Cancer Sister        breast, 68 or 32 yo   Stomach cancer Paternal Aunt    Stomach cancer Paternal Grandmother     Review of Systems  All other systems reviewed and are negative.   Exam:   BP 100/74   Pulse (!) 101   Ht 5' 6.5" (1.689 m)   Wt 177 lb (80.3 kg)   SpO2 96%   BMI 28.14 kg/m     General appearance: alert, cooperative and appears stated age Head: normocephalic, without obvious abnormality, atraumatic Neck: no adenopathy, supple, symmetrical, trachea midline and thyroid normal to inspection and palpation Lungs: clear to auscultation bilaterally Breasts: normal appearance, no masses or tenderness, No nipple retraction or dimpling, No nipple discharge or bleeding, No axillary adenopathy Heart: regular rate and rhythm Abdomen: soft, non-tender; no  masses, no organomegaly Extremities: extremities normal, atraumatic, no cyanosis or edema Skin: skin color, texture, turgor normal. No rashes or lesions Lymph nodes: cervical, supraclavicular, and axillary nodes normal. Neurologic: grossly normal  Pelvic: External genitalia:  no lesions              No abnormal inguinal nodes palpated.              Urethra:  normal appearing urethra with no masses, tenderness or lesions              Bartholins and Skenes: normal                 Vagina: normal appearing vagina with normal color and discharge, no lesions              Cervix: no lesions.  Stenosis noted.   IUD threads not seen.               Pap taken: yes Bimanual Exam:  Uterus:  normal size, contour, position, consistency, mobility, non-tender              Adnexa: no mass, fullness, tenderness              Rectal exam: yes.   Confirms.              Anus:  normal sphincter tone, no lesions  Chaperone was present for exam:  Estill Bamberg, CMA  Assessment:   Well woman visit with gynecologic exam. Mirena IUD.  IUD threads lost.  Cervical cancer screening.  FH breast and colon cancer.   Plan: Mammogram screening discussed. Self breast awareness reviewed. Pap and HR HPV collected.  Guidelines for Calcium, Vitamin D, regular exercise program including cardiovascular and weight bearing exercise. Return for pelvic ultrasound.  Back up pregnancy prevention recommended until [place] can confirm normal position of the IUD.  Referral for genetic counseling and testing.  Follow up annually and prn.   After visit summary provided.

## 2022-07-18 ENCOUNTER — Encounter: Payer: Self-pay | Admitting: Obstetrics and Gynecology

## 2022-07-18 ENCOUNTER — Other Ambulatory Visit (HOSPITAL_COMMUNITY)
Admission: RE | Admit: 2022-07-18 | Discharge: 2022-07-18 | Disposition: A | Discharge status: Home / Self Care | Payer: BC Managed Care – PPO | Source: Ambulatory Visit | Attending: Obstetrics and Gynecology | Admitting: Obstetrics and Gynecology

## 2022-07-18 ENCOUNTER — Ambulatory Visit (INDEPENDENT_AMBULATORY_CARE_PROVIDER_SITE_OTHER): Payer: BC Managed Care – PPO | Admitting: Obstetrics and Gynecology

## 2022-07-18 VITALS — BP 100/74 | HR 101 | Ht 66.5 in | Wt 177.0 lb

## 2022-07-18 DIAGNOSIS — Z124 Encounter for screening for malignant neoplasm of cervix: Secondary | ICD-10-CM

## 2022-07-18 DIAGNOSIS — Z01419 Encounter for gynecological examination (general) (routine) without abnormal findings: Secondary | ICD-10-CM | POA: Diagnosis not present

## 2022-07-18 DIAGNOSIS — Z800 Family history of malignant neoplasm of digestive organs: Secondary | ICD-10-CM

## 2022-07-18 DIAGNOSIS — Z803 Family history of malignant neoplasm of breast: Secondary | ICD-10-CM

## 2022-07-18 DIAGNOSIS — T8332XA Displacement of intrauterine contraceptive device, initial encounter: Secondary | ICD-10-CM | POA: Diagnosis not present

## 2022-07-18 NOTE — Patient Instructions (Signed)

## 2022-07-19 LAB — CYTOLOGY - PAP
Adequacy: ABSENT
Comment: NEGATIVE
Diagnosis: NEGATIVE
High risk HPV: NEGATIVE

## 2022-07-20 ENCOUNTER — Telehealth: Payer: Self-pay | Admitting: Licensed Clinical Social Worker

## 2022-07-20 DIAGNOSIS — E785 Hyperlipidemia, unspecified: Secondary | ICD-10-CM | POA: Diagnosis not present

## 2022-07-20 DIAGNOSIS — E669 Obesity, unspecified: Secondary | ICD-10-CM | POA: Diagnosis not present

## 2022-07-20 NOTE — Telephone Encounter (Signed)
Scheduled appt per 8/9 referral. Pt is aware of appt date and time. Pt is aware to arrive 15 mins prior to appt time and to bring and updated insurance card. Pt is aware of appt location.   

## 2022-07-30 ENCOUNTER — Other Ambulatory Visit: Payer: Self-pay

## 2022-07-30 DIAGNOSIS — T8332XD Displacement of intrauterine contraceptive device, subsequent encounter: Secondary | ICD-10-CM

## 2022-08-09 DIAGNOSIS — G4733 Obstructive sleep apnea (adult) (pediatric): Secondary | ICD-10-CM | POA: Diagnosis not present

## 2022-08-23 ENCOUNTER — Encounter: Payer: Self-pay | Admitting: Obstetrics and Gynecology

## 2022-08-23 ENCOUNTER — Ambulatory Visit (INDEPENDENT_AMBULATORY_CARE_PROVIDER_SITE_OTHER): Payer: BC Managed Care – PPO | Admitting: Obstetrics and Gynecology

## 2022-08-23 ENCOUNTER — Ambulatory Visit (INDEPENDENT_AMBULATORY_CARE_PROVIDER_SITE_OTHER): Payer: BC Managed Care – PPO

## 2022-08-23 VITALS — BP 110/80 | Ht 66.5 in | Wt 177.0 lb

## 2022-08-23 DIAGNOSIS — E785 Hyperlipidemia, unspecified: Secondary | ICD-10-CM | POA: Diagnosis not present

## 2022-08-23 DIAGNOSIS — T8332XD Displacement of intrauterine contraceptive device, subsequent encounter: Secondary | ICD-10-CM

## 2022-08-23 DIAGNOSIS — Z30431 Encounter for routine checking of intrauterine contraceptive device: Secondary | ICD-10-CM

## 2022-08-23 DIAGNOSIS — N83201 Unspecified ovarian cyst, right side: Secondary | ICD-10-CM

## 2022-08-23 DIAGNOSIS — N926 Irregular menstruation, unspecified: Secondary | ICD-10-CM | POA: Diagnosis not present

## 2022-08-23 DIAGNOSIS — E039 Hypothyroidism, unspecified: Secondary | ICD-10-CM | POA: Diagnosis not present

## 2022-08-23 NOTE — Progress Notes (Signed)
GYNECOLOGY  VISIT   HPI: 51 y.o.   Married  Caucasian  female   G2P2002 with No LMP recorded. (Menstrual status: IUD).   here for pelvic ultrasound for lost IUD threads.   Occasional bleeding every 20 days but not with regularity.  GYNECOLOGIC HISTORY: No LMP recorded. (Menstrual status: IUD). Contraception:  Mirena IUD 01/17/17 Menopausal hormone therapy:  none Last mammogram:  10-21-21 Neg/BiRads1 Last pap smear:  07/18/22 - neg, neg HR HPV, 09-30-14 Neg:Neg HR HPV          OB History     Gravida  2   Para  2   Term  2   Preterm      AB      Living  2      SAB      IAB      Ectopic      Multiple      Live Births                 Patient Active Problem List   Diagnosis Date Noted   Hypothyroidism 09/30/2014    Past Medical History:  Diagnosis Date   Encounter for insertion of mirena IUD 11/09/2011   History of migraine headaches    no aura   Hyperlipidemia 09/30/14   Hypothyroidism 09/30/2014   Thyroid disease     Past Surgical History:  Procedure Laterality Date   CESAREAN SECTION  x2   NASAL SEPTUM SURGERY     NECK SURGERY     TONSILLECTOMY      Current Outpatient Medications  Medication Sig Dispense Refill   acyclovir (ZOVIRAX) 400 MG tablet Take 400 mg by mouth 3 (three) times daily as needed (outbreaks).   2   FLUoxetine (PROZAC) 10 MG capsule Take by mouth.     ibuprofen (ADVIL,MOTRIN) 200 MG tablet Take 400-800 mg by mouth daily as needed for headache or moderate pain.      LORazepam (ATIVAN) 0.5 MG tablet Take 0.5 mg by mouth daily as needed for anxiety.   0   pantoprazole (PROTONIX) 40 MG tablet Take 40 mg by mouth daily as needed (indigestion).      rosuvastatin (CRESTOR) 20 MG tablet Take 20 mg by mouth at bedtime.     SYNTHROID 112 MCG tablet Take by mouth.     triamcinolone cream (KENALOG) 0.1 % APPLY 1 APPLICATION EXTERNALLY TO THE AFFECTED AREA TWICE A DAY     No current facility-administered medications for this visit.      ALLERGIES: Patient has no known allergies.  Family History  Problem Relation Age of Onset   Heart attack Mother    Stroke Mother    Stomach cancer Father    Cancer Sister        breast, 15 yo and colon cancer, 51 yo   Breast cancer Sister    Cancer Sister        breast, 46 or 49 yo   Stomach cancer Paternal Aunt    Stomach cancer Paternal Grandmother     Social History   Socioeconomic History   Marital status: Married    Spouse name: Not on file   Number of children: Not on file   Years of education: Not on file   Highest education level: Not on file  Occupational History   Not on file  Tobacco Use   Smoking status: Never   Smokeless tobacco: Never  Vaping Use   Vaping Use: Never used  Substance and Sexual  Activity   Alcohol use: No   Drug use: No   Sexual activity: Yes    Partners: Male    Birth control/protection: I.U.D.    Comment: Mirena IUD 01-24-17  Other Topics Concern   Not on file  Social History Narrative   Lives with spouse   Works in Press photographer   Caffeine: 2+ servings daily, soda   2 children - 1 boy, 1 girl   Secretary/administrator grad   Social Determinants of Health   Financial Resource Strain: Not on file  Food Insecurity: Not on file  Transportation Needs: Not on file  Physical Activity: Not on file  Stress: Not on file  Social Connections: Not on file  Intimate Partner Violence: Not on file    Review of Systems  All other systems reviewed and are negative.   PHYSICAL EXAMINATION:    BP 110/80   Ht 5' 6.5" (1.689 m)   Wt 177 lb (80.3 kg)   BMI 28.14 kg/m     General appearance: alert, cooperative and appears stated age   Pelvic US  Uterus 8.81 x 5.96 x 4.68 cm.  No myometrial masses.  EMS 2.38 mm. IUD in proper position in uterine cavity.  String 1 cm from external os.  Left ovary 2.94 x 1.19 x 0.87 cm.  Right ovary 4.67 x 3.65 x 2.50 cm.   2 cysts:  3.8 x 3.4 x 2.1 cm, avascular with 2 septations.                2.1 x 1.2 cm with 4  mm solid component along internal wall.   ASSESSMENT  Mirena IUD check up.  Right ovarian cysts. Complex. FH breast cancer and colon cancer.  PLAN  Pelvic US images and report reviewed.  We discussed ovarian cysts.  Will check CA125, FSH and estradiol.  Possible false positive CA125 results in premenopausal females discussed.  Return for pelvic US in 6 weeks.  She will complete her genetic counseling and possible testing in October, 2023.   An After Visit Summary was printed and given to the patient.  22 min  total time was spent for this patient encounter, including preparation, face-to-face counseling with the patient, coordination of care, and documentation of the encounter.

## 2022-08-23 NOTE — Patient Instructions (Signed)
Cisto ovariano Ovarian Cyst  Um cisto ovariano  uma bolsa cheia de lquido que se forma em um ovrio. Os ovrios so pequenos rgos que produzem vulos nas mulheres. Vrios tipos de cistos podem se formar nos ovrios. Alguns podem causar sintomas e exigir tratamento. A maioria dos cistos ovarianos desaparece por conta prpria, no  cancerosa ( benigna) e no causa problemas. Quais so as causas? Cistos ovarianos podem ser causados por: Sndrome de hiperestimulao ovariana. Esse  um quadro clnico que pode se desenvolver ao tomar medicamentos para fertilidade. Ela causa a formao de vrios cistos ovarianos grandes. Sndrome do ovrio policstico (SOP). Trata-se de um distrbio hormonal comum que pode levar  formao de cistos ovarianos e causar problemas com a menstruao e a fertilidade. O ciclo menstrual normal. O que aumenta o risco? Os fatores a seguir podem tornar voc mais suscetvel a desenvolver esse quadro clnico: Sobrepeso ou obesidade. Medicamentos para fertilidade. Tomar certos tipos de anticoncepcionais. Tabagismo. Quais so os sinais ou sintomas? Muitos cistos ovarianos no causam sintomas. Se houver sintomas, eles podem incluir: Dor e presso na pelve. Dor na parte inferior do abdome. Dor durante as relaes sexuais. Inchao abdominal. Perodos menstruais anormais. [place] dor nos perodos menstruais. Como esse quadro clnico  diagnosticado? Esses cistos so comumente encontrados durante exames plvicos de rotina. Voc poder passar por exames para se descobrir mais sobre os cistos, incluindo: Ultrassom. Tomografia computadorizada (TC). Ressonncia magntica (RM). Exames de sangue. Como esse quadro clnico  tratado? Muitos cistos ovarianos desaparecem por conta prpria sem tratamento. Seu mdico poder querer verificar seus cistos regularmente por 2-3 meses para ver se ocorrem alteraes. Caso voc esteja na menopausa, ser especialmente importante que seu  cisto seja monitorado cuidadosamente, uma vez que mulheres na menopausa apresentam maior taxa de cncer de ovrio. Quando tratamento  necessrio, ele pode incluir: Medicamentos para ajudar a Public house manager a dor. Um procedimento para drenar o cisto (aspirao). Cirurgia para remover todo o cisto (cistectomia). Tratamento hormonal ou plulas anticoncepcionais. Esses mtodos so usados em [employer] para ajudar a evitar que os cistos voltem. Cirurgia para remover o ovrio (ooforectomia). Siga estas instrues em casa: Tome medicamentos vendidos com ou sem receita mdica somente de acordo com as indicaes do seu mdico. Pergunte ao seu mdico se algum medicamento prescrito requer que voc evite dirigir ou operar mquinas. Faa exames regulares da pelve e testes de [place] com a frequncia determinada pelo seu mdico. Retorne a suas atividades normais de acordo com as orientaes do seu mdico. Pergunte ao seu mdico quais atividades so seguras para voc. No use nenhum produto que contenha nicotina ou tabaco, como cigarros tradicionais, cigarros eletrnicos e fumo de Higher education careers adviser. Caso precise de ajuda para parar de fumar, fale com seu mdico. Comparea a todas as consultas de acompanhamento. Isso  importante. Entre em contato com um mdico se: Suas menstruaes atrasarem, ficarem irregulares, dolorosas ou pararem. Sentir dor contnua na pelve. Sentir presso sobre a bexiga ou dificuldade para esvazi-la completamente. Voc apresentar qualquer um dos seguintes sintomas: Sensao de distenso. [place] ou perder peso sem alterar seus hbitos de exerccio e de alimentao. Dor, inchao ou estufamento no abdome. Perda de apetite. Dor e presso nas costas e na pelve. Achar que pode estar grvida. Busque ajuda imediatamente se: Sentir dor abdominal ou plvica intensa ou que piora. No conseguir comer ou beber sem vomitar. Tiver febre ou calafrios de forma repentina. Seu perodo menstrual ficar muito  mais intenso que o habitual. Resumo Um cisto ovariano  uma bolsa cheia de  lquido que se forma em um ovrio. Alguns cistos ovarianos podem causar sintomas e exigir tratamento. Esses cistos so comumente encontrados durante exames plvicos de rotina. Muitos cistos ovarianos desaparecem por conta prpria sem tratamento. Estas informaes no se destinam a substituir as recomendaes de seu mdico. No deixe de discutir quaisquer dvidas com seu mdico. Document Revised: 06/14/2020 Document Reviewed: 06/14/2020 Elsevier Patient Education  [place].

## 2022-08-24 LAB — CA 125: CA 125: 11 U/mL (ref <35)

## 2022-08-24 LAB — FOLLICLE STIMULATING HORMONE: FSH: 13.1 m[IU]/mL

## 2022-08-24 LAB — ESTRADIOL: Estradiol: 293 pg/mL

## 2022-09-08 DIAGNOSIS — G4733 Obstructive sleep apnea (adult) (pediatric): Secondary | ICD-10-CM | POA: Diagnosis not present

## 2022-09-11 ENCOUNTER — Other Ambulatory Visit: Payer: Self-pay | Admitting: *Deleted

## 2022-09-11 DIAGNOSIS — N83201 Unspecified ovarian cyst, right side: Secondary | ICD-10-CM

## 2022-09-17 ENCOUNTER — Encounter: Payer: Self-pay | Admitting: Licensed Clinical Social Worker

## 2022-09-17 ENCOUNTER — Inpatient Hospital Stay: Payer: BC Managed Care – PPO | Attending: Genetic Counselor | Admitting: Licensed Clinical Social Worker

## 2022-09-17 ENCOUNTER — Other Ambulatory Visit: Payer: Self-pay | Admitting: Licensed Clinical Social Worker

## 2022-09-17 ENCOUNTER — Inpatient Hospital Stay: Payer: BC Managed Care – PPO

## 2022-09-17 DIAGNOSIS — Z803 Family history of malignant neoplasm of breast: Secondary | ICD-10-CM

## 2022-09-17 DIAGNOSIS — Z800 Family history of malignant neoplasm of digestive organs: Secondary | ICD-10-CM | POA: Diagnosis not present

## 2022-09-17 LAB — GENETIC SCREENING ORDER

## 2022-09-17 NOTE — Progress Notes (Signed)
REFERRING PROVIDER: Nunzio Cobbs, MD [address] [place],  [place] [address]  PRIMARY PROVIDER:  [facility]  PRIMARY REASON FOR VISIT:  1. Family history of breast cancer   2. Family history of colon cancer   3. Family history of stomach cancer      HISTORY OF PRESENT ILLNESS:   Brianna [name], a 51 y.o. female, was seen for a [place] cancer genetics consultation at the request of Dr. Yisroel Ramming due to a family history of breast and stomach cancer.  Brianna [name] presents to clinic today to discuss the possibility of a hereditary predisposition to cancer, genetic testing, and to further clarify her future cancer risks, as well as potential cancer risks for family members.   CANCER HISTORY:  Brianna [name] is a 51 y.o. female with no personal history of cancer.    RISK FACTORS:  Menarche was at age 2.  First live birth at age 69.  OCP use for approximately  30-35  years.  Ovaries intact: yes.  Hysterectomy: no.  Menopausal status: premenopausal.  HRT use: 0 years. Colonoscopy: no; not examined. Mammogram within the last year: yes. Number of breast biopsies: 0. Up to date with pelvic exams: yes.  Past Medical History:  Diagnosis Date   Encounter for insertion of mirena IUD 11/09/2011   History of migraine headaches    no aura   Hyperlipidemia 09/30/14   Hypothyroidism 09/30/2014   Thyroid disease     Past Surgical History:  Procedure Laterality Date   CESAREAN SECTION  x2   NASAL SEPTUM SURGERY     NECK SURGERY     TONSILLECTOMY      FAMILY HISTORY:  We obtained a detailed, 4-generation family history.  Significant diagnoses are listed below: Family History  Problem Relation Age of Onset   Heart attack Mother    Stroke Mother    Stomach cancer Father 16   Breast cancer Sister 18   Colon cancer Sister 31   Uterine cancer Sister        vs ovarian   Breast cancer Sister 68   Stomach cancer Paternal  Aunt    Stomach cancer Paternal Grandmother 27   Brianna [name] has 1 daughter, 48, 1 son, 43. She has 2 sisters and 7 brothers. One sister had breast cancer at 21, colon cancer at 53, and either uterine or ovarian cancer. She is living at 54, no genetic testing. Her other sister had breast cancer at 50 or 47 and is living at 101, no genetic testing.   Brianna [name] mother passed at 70. No known cancers on her side of the family.  Brianna [name] father passed of stomach cancer at 40. Patient had 3 paternal aunts who also passed of stomach cancer over age 34. Paternal grandmother passed of stomach cancer at 31.   Brianna [name] is unaware of previous family history of genetic testing for hereditary cancer risks. There is no reported Ashkenazi Jewish ancestry. There is no known consanguinity.   GENETIC COUNSELING ASSESSMENT: Brianna [name] is a 51 y.o. female with a family history of early onset breast cancer and multiple relatives with stomach cancer which is somewhat suggestive of a hereditary cancer syndrome and predisposition to cancer. We, therefore, discussed and recommended the following at today's visit.   DISCUSSION: We discussed that approximately 10% of breast cancer is hereditary. Most cases of hereditary breast cancer are associated with BRCA1/BRCA2 genes. There are other genes  associated with hereditary cancer as well. Stomach cancer can be associated with CDH1 and Lynch syndrome genes, among others. Cancers and risks are gene specific. We discussed that testing is beneficial for several reasons including knowing about cancer risks, identifying potential screening and risk-reduction options that may be appropriate, and to understand if other family members could be at risk for cancer and allow them to undergo genetic testing.   We reviewed the characteristics, features and inheritance patterns of hereditary cancer syndromes. We also discussed genetic testing, including the appropriate family  members to test, the process of testing, insurance coverage and turn-around-time for results. We discussed the implications of a negative, positive and/or variant of uncertain significant result. We recommended Brianna [name] pursue genetic testing for the [facility] CancerNext-Expanded+RNA gene panel.   Based on Brianna [name]'s family history of cancer, she meets medical criteria for genetic testing. Despite that she meets criteria, she may still have an out of pocket cost. We discussed that if her out of pocket cost for testing is over $100, the laboratory will call and confirm whether she wants to proceed with testing.  If the out of pocket cost of testing is less than $100 she will be billed by the genetic testing laboratory.   PLAN: After considering the risks, benefits, and limitations, Brianna [name] provided informed consent to pursue genetic testing and the blood sample was sent to [facility] for analysis of the CancerNext-Expanded+RNA panel. Results should be available within approximately 2-3 weeks' time, at which point they will be disclosed by telephone to Brianna [name], as will any additional recommendations warranted by these results. Brianna [name] will receive a summary of her genetic counseling visit and a copy of her results once available. This information will also be available in Epic.   Brianna [name] questions were answered to her satisfaction today. Our contact information was provided should additional questions or concerns arise. Thank you for the referral and allowing [place] to share in the care of your patient.   Brianna Rogue, MS, [facility] Genetic Counselor [email or web address] Phone: [phone]  The patient was seen for a total of 20 minutes in face-to-face genetic counseling.  Dr. Grayland Ormond was available for discussion regarding this case.   _______________________________________________________________________ For Office Staff:  Number of people involved in session:  1 Was an Intern/ student involved with case: no

## 2022-09-18 DIAGNOSIS — M25522 Pain in left elbow: Secondary | ICD-10-CM | POA: Diagnosis not present

## 2022-09-18 DIAGNOSIS — S56812S Strain of other muscles, fascia and tendons at forearm level, left arm, sequela: Secondary | ICD-10-CM | POA: Diagnosis not present

## 2022-09-18 DIAGNOSIS — M6281 Muscle weakness (generalized): Secondary | ICD-10-CM | POA: Diagnosis not present

## 2022-09-19 ENCOUNTER — Other Ambulatory Visit: Payer: Self-pay | Admitting: Obstetrics and Gynecology

## 2022-09-19 DIAGNOSIS — Z1231 Encounter for screening mammogram for malignant neoplasm of breast: Secondary | ICD-10-CM

## 2022-10-01 ENCOUNTER — Telehealth: Payer: Self-pay | Admitting: Licensed Clinical Social Worker

## 2022-10-04 DIAGNOSIS — G4733 Obstructive sleep apnea (adult) (pediatric): Secondary | ICD-10-CM | POA: Diagnosis not present

## 2022-10-09 ENCOUNTER — Other Ambulatory Visit: Payer: BC Managed Care – PPO | Admitting: Obstetrics and Gynecology

## 2022-10-09 ENCOUNTER — Other Ambulatory Visit: Payer: BC Managed Care – PPO

## 2022-10-09 DIAGNOSIS — G4733 Obstructive sleep apnea (adult) (pediatric): Secondary | ICD-10-CM | POA: Diagnosis not present

## 2022-10-10 DIAGNOSIS — G4733 Obstructive sleep apnea (adult) (pediatric): Secondary | ICD-10-CM | POA: Diagnosis not present

## 2022-10-15 ENCOUNTER — Encounter: Payer: Self-pay | Admitting: Obstetrics and Gynecology

## 2022-10-15 NOTE — Telephone Encounter (Signed)
Per Rosemarie Ax "There are no available appointments with anyone for the afternoons. FYI: Patient is scheduled for [place] this Thursday. "

## 2022-10-16 ENCOUNTER — Ambulatory Visit: Payer: BC Managed Care – PPO | Admitting: Obstetrics and Gynecology

## 2022-10-16 VITALS — BP 118/78 | HR 88 | Resp 20

## 2022-10-16 DIAGNOSIS — N760 Acute vaginitis: Secondary | ICD-10-CM

## 2022-10-16 LAB — WET PREP FOR TRICH, YEAST, CLUE

## 2022-10-16 MED ORDER — NYSTATIN-TRIAMCINOLONE 100000-0.1 UNIT/GM-% EX OINT: 1.0000 | TOPICAL_OINTMENT | Freq: Two times a day (BID) | CUTANEOUS | 0 refills | Status: DC

## 2022-10-16 MED ORDER — FLUCONAZOLE 150 MG PO TABS: 150.0000 mg | ORAL_TABLET | Freq: Once | ORAL | 0 refills | Status: AC

## 2022-10-16 MED ORDER — METRONIDAZOLE 500 MG PO TABS: 500.0000 mg | ORAL_TABLET | Freq: Two times a day (BID) | ORAL | 0 refills | Status: DC

## 2022-10-16 NOTE — Progress Notes (Unsigned)
GYNECOLOGY  VISIT   HPI: 51 y.o.   Married  Caucasian  female   G2P2002 with Patient's last menstrual period was 10/06/2022 (approximate).   here for vaginal itching and burning x 4 days.  Monistat 1 did not help.   No new exposures.   Got Covid and took Azithromycin for sinusitis.   GYNECOLOGIC HISTORY: Patient's last menstrual period was 10/06/2022 (approximate). Contraception:  IUD Mirena 01/17/17 Menopausal hormone therapy:  N/A Last mammogram:  10/21/21 BiRads 1 neg Last pap smear:   07/18/22 neg, HPV neg        OB History     Gravida  2   Para  2   Term  2   Preterm      AB      Living  2      SAB      IAB      Ectopic      Multiple      Live Births                 Patient Active Problem List   Diagnosis Date Noted   Hypothyroidism 09/30/2014    Past Medical History:  Diagnosis Date   Encounter for insertion of mirena IUD 11/09/2011   History of migraine headaches    no aura   Hyperlipidemia 09/30/14   Hypothyroidism 09/30/2014   Thyroid disease     Past Surgical History:  Procedure Laterality Date   CESAREAN SECTION  x2   NASAL SEPTUM SURGERY     NECK SURGERY     TONSILLECTOMY      Current Outpatient Medications  Medication Sig Dispense Refill   acyclovir (ZOVIRAX) 400 MG tablet Take 400 mg by mouth 3 (three) times daily as needed (outbreaks).   2   FLUoxetine (PROZAC) 10 MG capsule Take by mouth.     ibuprofen (ADVIL,MOTRIN) 200 MG tablet Take 400-800 mg by mouth daily as needed for headache or moderate pain.      LORazepam (ATIVAN) 0.5 MG tablet Take 0.5 mg by mouth daily as needed for anxiety.   0   pantoprazole (PROTONIX) 40 MG tablet Take 40 mg by mouth daily as needed (indigestion).      rosuvastatin (CRESTOR) 20 MG tablet Take 20 mg by mouth at bedtime.     SYNTHROID 112 MCG tablet Take by mouth.     triamcinolone cream (KENALOG) 0.1 % APPLY 1 APPLICATION EXTERNALLY TO THE AFFECTED AREA TWICE A DAY     No current  facility-administered medications for this visit.     ALLERGIES: Patient has no known allergies.  Family History  Problem Relation Age of Onset   Heart attack Mother    Stroke Mother    Stomach cancer Father 5   Breast cancer Sister 58   Colon cancer Sister 71   Uterine cancer Sister        vs ovarian   Breast cancer Sister 89   Stomach cancer Paternal Aunt    Stomach cancer Paternal Grandmother 96    Social History   Socioeconomic History   Marital status: Married    Spouse name: Not on file   Number of children: Not on file   Years of education: Not on file   Highest education level: Not on file  Occupational History   Not on file  Tobacco Use   Smoking status: Never   Smokeless tobacco: Never  Vaping Use   Vaping Use: Never used  Substance and Sexual Activity  Alcohol use: No   Drug use: No   Sexual activity: Yes    Partners: Male    Birth control/protection: I.U.D.    Comment: Mirena IUD 01-24-17  Other Topics Concern   Not on file  Social History Narrative   Lives with spouse   Works in Press photographer   Caffeine: 2+ servings daily, soda   2 children - 1 boy, 1 girl   Secretary/administrator grad   Social Determinants of Health   Financial Resource Strain: Not on file  Food Insecurity: Not on file  Transportation Needs: Not on file  Physical Activity: Not on file  Stress: Not on file  Social Connections: Not on file  Intimate Partner Violence: Not on file    Review of Systems  Genitourinary:        Vaginal itching and burning    PHYSICAL EXAMINATION:    BP 118/78 (BP Location: Right Arm, Patient Position: Sitting)   Pulse 88   Resp 20   LMP 10/06/2022 (Approximate)   SpO2 95%     General appearance: alert, cooperative and appears stated age   Pelvic: External genitalia: bilateral labia with excoriation of skin.               Urethra:  normal appearing urethra with no masses, tenderness or lesions              Bartholins and Skenes: normal                  Vagina: normal appearing vagina with normal color and discharge, no lesions              Cervix: no lesions.  Menstrual flow.                 Bimanual Exam:  Uterus:  normal size, contour, position, consistency, mobility, non-tender              Adnexa: no mass, fullness, tenderness            Chaperone was present for exam:  Sharee Pimple, RN  ASSESSMENT  Vulvovaginitis.  Recent abx.  I suspect yeast infection.  Right ovarian cyst.  Recent genetic testing.    PLAN  Wet prep:  negative yeast, positive clue cells, negative trichomonas.  Flagyl 500 mg po bid x 7 days.  Diflucan Mycology II.   An After Visit Summary was printed and given to the patient.  ______ minutes face to face time of which over 50% was spent in counseling.

## 2022-10-16 NOTE — Telephone Encounter (Signed)
Appt scheduled for 11/7 at 3:15PM.   MyChart message to patient to notify.   Routing to Dr. Antony Blackbird.   Encounter closed.

## 2022-10-17 ENCOUNTER — Encounter: Payer: Self-pay | Admitting: Licensed Clinical Social Worker

## 2022-10-17 DIAGNOSIS — Z1379 Encounter for other screening for genetic and chromosomal anomalies: Secondary | ICD-10-CM

## 2022-10-17 HISTORY — DX: Encounter for other screening for genetic and chromosomal anomalies: Z13.79

## 2022-10-17 NOTE — Telephone Encounter (Signed)
I contacted Brianna [name] to discuss her genetic testing results. No pathogenic variants were identified in the 77 genes analyzed. Detailed clinic note to follow.   The test report has been scanned into EPIC and is located under the Molecular Pathology section of the Results Review tab.  A portion of the result report is included below for reference.      Faith Rogue, MS, [facility] Genetic Counselor [email or web address] Phone: [phone]

## 2022-10-18 ENCOUNTER — Ambulatory Visit: Payer: BC Managed Care – PPO | Admitting: Obstetrics and Gynecology

## 2022-10-18 ENCOUNTER — Ambulatory Visit: Payer: Self-pay | Admitting: Licensed Clinical Social Worker

## 2022-10-18 ENCOUNTER — Ambulatory Visit (INDEPENDENT_AMBULATORY_CARE_PROVIDER_SITE_OTHER): Payer: BC Managed Care – PPO

## 2022-10-18 ENCOUNTER — Encounter: Payer: Self-pay | Admitting: Obstetrics and Gynecology

## 2022-10-18 VITALS — BP 110/80 | Resp 14

## 2022-10-18 DIAGNOSIS — Z1379 Encounter for other screening for genetic and chromosomal anomalies: Secondary | ICD-10-CM

## 2022-10-18 DIAGNOSIS — N83201 Unspecified ovarian cyst, right side: Secondary | ICD-10-CM

## 2022-10-18 DIAGNOSIS — Z803 Family history of malignant neoplasm of breast: Secondary | ICD-10-CM | POA: Diagnosis not present

## 2022-10-18 NOTE — Progress Notes (Signed)
GYNECOLOGY  VISIT   HPI: 51 y.o.   Married  Caucasian  female   G2P2002 with Patient's last menstrual period was 10/06/2022 (approximate).   here for  pelvic US to check right ovarian cysts.   US done on 08/23/22 to check for IUD due to absent threads on pelvic exam showed right ovary 4.67 x 3.65 x 2.50 cm with 2 cysts:  3.8 x 3.4 x 2.1 cm, avascular with 2 septations and 2.1 x 1.2 cm with 4 mm solid component along internal wall.  CA125 was 11 on 08/23/22.  Feels much better after initiating treatment for yeast and BV initiated on 10/16/22 after office visit.    Has colonoscopy on 11/16/22.   GYNECOLOGIC HISTORY: Patient's last menstrual period was 10/06/2022 (approximate). Contraception:  IUD Mirena 01/17/17  Menopausal hormone therapy:  n/a Last mammogram:   10/21/21 BiRads 1 neg.  Has appointment.   Last pap smear:  07/18/22 neg, HPV neg         OB History     Gravida  2   Para  2   Term  2   Preterm      AB      Living  2      SAB      IAB      Ectopic      Multiple      Live Births                 Patient Active Problem List   Diagnosis Date Noted   Genetic testing 10/17/2022   Hypothyroidism 09/30/2014    Past Medical History:  Diagnosis Date   Encounter for insertion of mirena IUD 11/09/2011   History of migraine headaches    no aura   Hyperlipidemia 09/30/14   Hypothyroidism 09/30/2014   Thyroid disease     Past Surgical History:  Procedure Laterality Date   CESAREAN SECTION  x2   NASAL SEPTUM SURGERY     NECK SURGERY     TONSILLECTOMY      Current Outpatient Medications  Medication Sig Dispense Refill   acyclovir (ZOVIRAX) 400 MG tablet Take 400 mg by mouth 3 (three) times daily as needed (outbreaks).   2   FLUoxetine (PROZAC) 10 MG capsule Take by mouth.     ibuprofen (ADVIL,MOTRIN) 200 MG tablet Take 400-800 mg by mouth daily as needed for headache or moderate pain.      LORazepam (ATIVAN) 0.5 MG tablet Take 0.5 mg by mouth daily as  needed for anxiety.   0   metroNIDAZOLE (FLAGYL) 500 MG tablet Take 1 tablet (500 mg total) by mouth 2 (two) times daily. 14 tablet 0   nystatin-triamcinolone ointment (MYCOLOG) Apply 1 Application topically 2 (two) times daily. Use twice a day for one week. 30 g 0   pantoprazole (PROTONIX) 40 MG tablet Take 40 mg by mouth daily as needed (indigestion).      rosuvastatin (CRESTOR) 20 MG tablet Take 20 mg by mouth at bedtime.     SYNTHROID 112 MCG tablet Take by mouth.     triamcinolone cream (KENALOG) 0.1 % APPLY 1 APPLICATION EXTERNALLY TO THE AFFECTED AREA TWICE A DAY     No current facility-administered medications for this visit.     ALLERGIES: Patient has no known allergies.  Family History  Problem Relation Age of Onset   Heart attack Mother    Stroke Mother    Stomach cancer Father 40   Breast cancer Sister 35  Colon cancer Sister 55   Uterine cancer Sister        vs ovarian   Breast cancer Sister 73   Stomach cancer Paternal Aunt    Stomach cancer Paternal Grandmother 28    Social History   Socioeconomic History   Marital status: Married    Spouse name: Not on file   Number of children: Not on file   Years of education: Not on file   Highest education level: Not on file  Occupational History   Not on file  Tobacco Use   Smoking status: Never   Smokeless tobacco: Never  Vaping Use   Vaping Use: Never used  Substance and Sexual Activity   Alcohol use: No   Drug use: No   Sexual activity: Yes    Partners: Male    Birth control/protection: I.U.D.    Comment: Mirena IUD 01-24-17  Other Topics Concern   Not on file  Social History Narrative   Lives with spouse   Works in Press photographer   Caffeine: 2+ servings daily, soda   2 children - 1 boy, 1 girl   Secretary/administrator grad   Social Determinants of Health   Financial Resource Strain: Not on file  Food Insecurity: Not on file  Transportation Needs: Not on file  Physical Activity: Not on file  Stress: Not on file   Social Connections: Not on file  Intimate Partner Violence: Not on file    Review of Systems  All other systems reviewed and are negative.   PHYSICAL EXAMINATION:    BP 110/80 (BP Location: Right Arm, Patient Position: Sitting, Cuff Size: Normal)   Resp 14   LMP 10/06/2022 (Approximate)     General appearance: alert, cooperative and appears stated age   Pelvic US Uterus 8.42 x 6.11 x 4.78 mm   No myometrial masses.  EMS 5.23 mm.  IUD in endometrial canal.  Left ovary 3.11 x 1.72 x 0.79 cm.  Right ovary 5.74 x 4.04 x 3.28 cm.  2 cysts:  3.8 x 3.4 x 2.8 cm, slightly larger.  Septation noted. 2.16 x 1.36 cm cyst with solid component shows no significant change in size or appearance. No free fluid.   ASSESSMENT  Right ovarian cysts.  I suspect a cystadenoma.  Strong family history of breast cancer.  Personal breast cancer risk of 19.7%.  Negative genetic testing.   PLAN  We discussed her ultrasound images and report.  Will proceed with a laparoscopic right oophorectomy and bilateral salpingectomy.  Will also do collection of pelvic washings.  Risks of laparoscopy include but are not limited to bleeding, infection, damage to surrounding organs, reaction to anesthesia, pneumonia, DVT, PE, death, hernia formation. Surgical expectations and recovery discussed.  Patient wishes to proceed.  Referral to Dr. Lindi Adie regarding breast cancer risk.    An After Visit Summary was printed and given to the patient.

## 2022-10-18 NOTE — Progress Notes (Signed)
HPI:   Brianna Brianna [name] was previously seen Brianna [name] the [place] Brianna [name] due to a family history of cancer and concerns regarding a hereditary predisposition to cancer. Please refer to our prior cancer genetics Brianna [name] note for more information regarding our discussion, assessment and recommendations, at the time. Brianna Brianna [name] recent genetic test results were disclosed to her, as were recommendations warranted by these results. These results and recommendations are discussed Brianna [name] more detail below.  CANCER HISTORY:  Oncology History   No history exists.    FAMILY HISTORY:  We obtained a detailed, 4-generation family history.  Significant diagnoses are listed below: Family History  Problem Relation Age of Onset   Heart attack Mother    Stroke Mother    Stomach cancer Father 69   Breast cancer Sister 18   Colon cancer Sister 76   Uterine cancer Sister        vs ovarian   Breast cancer Sister 77   Stomach cancer Paternal Aunt    Stomach cancer Paternal Grandmother 76    Brianna Brianna [name] has 1 daughter, 39, 1 son, 58. She has 2 sisters and 7 brothers. One sister had breast cancer at 29, colon cancer at 54, and either uterine or ovarian cancer. She is living at 10, no genetic testing. Her other sister had breast cancer at 51 or 58 and is living at 45, no genetic testing.    Brianna Brianna [name] mother passed at 64. No known cancers on her side of the family.   Brianna Brianna [name] father passed of stomach cancer at 81. Patient had 3 paternal aunts who also passed of stomach cancer over age 44. Paternal grandmother passed of stomach cancer at 65.    Brianna Brianna [name] is unaware of previous family history of genetic testing for hereditary cancer risks. There is no reported Ashkenazi Jewish ancestry. There is no known consanguinity.     GENETIC TEST RESULTS:  The [employer] CancerNext-Expanded+RNA Panel found no pathogenic mutations.   The CancerNext-Expanded + RNAinsight gene panel offered by [employer] and includes sequencing and rearrangement analysis for the following 77 genes: IP, ALK, APC*, ATM*, AXIN2, BAP1, BARD1, BLM, BMPR1A, BRCA1*, BRCA2*, BRIP1*, CDC73, CDH1*,CDK4, CDKN1B, CDKN2A, CHEK2*, CTNNA1, DICER1, FANCC, FH, FLCN, GALNT12, KIF1B, LZTR1, MAX, MEN1, MET, MLH1*, MSH2*, MSH3, MSH6*, MUTYH*, NBN, NF1*, NF2, NTHL1, PALB2*, PHOX2B, PMS2*, POT1, PRKAR1A, PTCH1, PTEN*, RAD51C*, RAD51D*,RB1, RECQL, RET, SDHA, SDHAF2, SDHB, SDHC, SDHD, SMAD4, SMARCA4, SMARCB1, SMARCE1, STK11, SUFU, TMEM127, TP53*,TSC1, TSC2, VHL and XRCC2 (sequencing and deletion/duplication); EGFR, EGLN1, HOXB13, KIT, MITF, PDGFRA, POLD1 and POLE (sequencing only); EPCAM and GREM1 (deletion/duplication only).   The test report has been scanned into EPIC and is located under the Molecular Pathology section of the Results Review tab.  A portion of the result report is included below for reference. Genetic testing reported out on 09/28/2022.      Even though a pathogenic variant was not identified, possible explanations for the cancer Brianna [name] the family may include: There may be no hereditary risk for cancer Brianna [name] the family. The cancers Brianna [name]  her family may be sporadic/familial or due to other genetic and environmental factors. There may be a gene mutation Brianna [name] one of these genes that current testing methods cannot detect but that chance is small. There could be another gene that has not yet been discovered, or that we have not yet tested, that is responsible for the cancer diagnoses Brianna [name] the family.  It is also possible there is a hereditary cause for the  cancer Brianna [name] the family that Brianna Brianna [name] did not inherit.  Therefore, it is important to remain Brianna [name] touch with cancer genetics Brianna [name] the future so that we can continue to offer Brianna Brianna [name] the most up to date genetic testing.   ADDITIONAL GENETIC TESTING:  We discussed with Brianna Brianna [name] that her genetic testing was fairly extensive.  If there are additional relevant genes identified  to increase cancer risk that can be analyzed Brianna [name] the future, we would be happy to discuss and coordinate this testing at that time.    CANCER SCREENING RECOMMENDATIONS:  Brianna Brianna [name] test result is considered negative (normal).  This means that we have not identified a hereditary cause for her family history of cancer at this time.   An individual's cancer risk and medical management are not determined by genetic test results alone. Overall cancer risk assessment incorporates additional factors, including personal medical history, family history, and any available genetic information that may result Brianna [name] a personalized plan for cancer prevention and surveillance. Therefore, it is recommended she continue to follow the cancer management and screening guidelines provided by her  primary healthcare provider.  Based on the reported personal and family history, specific cancer screenings for Brianna Brianna [name] and her family include:  Breast Cancer Screening:  The Brianna [name] model is one of multiple prediction models developed to estimate an individual's lifetime risk of developing breast cancer. The Brianna [name] model is endorsed by the [employer]  (NCCN). This model includes many risk factors such as family history, endogenous estrogen exposure, and benign breast disease. The calculation is highly-dependent on the accuracy of clinical data provided by the patient and can change over time. The Brianna [name] model may be repeated to reflect new information Brianna [name] her personal or family history Brianna [name] the future.    Brianna Brianna [name] risk score is 19.7%.  For women with a greater than 20% lifetime risk of breast cancer, the NCCN recommends the following:    1.   Clinical encounter every 6-12 months to begin when identified as being at increased risk, but not before age 48    2.   Annual mammograms, tomosynthesis is recommended starting 10 years earlier than the youngest breast  cancer diagnosis Brianna [name] the family or at age 63 (whichever comes first), but not before age 53     3.   Annual breast MRI starting 10 years earlier than the youngest breast cancer diagnosis Brianna [name] the family or at age 4 (whichever comes first), but not before age 22   We have offered a referral for Brianna Brianna [name]. Brianna Brianna [name] would prefer to discuss with Dr. Quincy Simmonds first.     Colon Cancer Screening: Due to Brianna Brianna [name]'s family's history of colon cancer she is recommended to repeat colonoscopies at least every 5 years. More frequent colonoscopies may be recommended if polyps are identified.  RECOMMENDATIONS FOR FAMILY MEMBERS:   Since she did not inherit a identifiable mutation Brianna [name] a cancer predisposition gene included on this panel, her children could not have inherited a known mutation from her Brianna [name] one of these genes. Individuals Brianna [name] this family might be at some increased risk of developing cancer, over the general population risk, due to the family history of cancer.  Individuals Brianna [name] the family should notify their providers of the family history of cancer. We recommend women Brianna [name] this family have a yearly mammogram beginning at age 19, or 44 years younger than the earliest onset of cancer, an annual clinical breast  exam, and perform monthly breast self-exams.  Family members should have colonoscopies by at age 79, or earlier, as recommended by their providers. Other members of the family may still carry a pathogenic variant Brianna [name] one of these genes that Ms. Arif did not inherit. Based on the family history, we recommend her sisters who have had cancer to have genetic counseling and testing. Ms. Neises will let us know if we can be of any assistance Brianna [name] coordinating genetic counseling and/or testing for this family member.    FOLLOW-UP:  Lastly, we discussed with Ms. Hellmer that cancer genetics is a rapidly advancing field and it is possible that new genetic tests will be appropriate for  her and/or her family members Brianna [name] the future. We encouraged her to remain Brianna [name] contact with cancer genetics on an annual basis so we can update her personal and family histories and let her know of advances Brianna [name] cancer genetics that may benefit this family.   Our contact number was provided. Ms. Trejos questions were answered to her satisfaction, and she knows she is welcome to call us at anytime with additional questions or concerns.    Faith Rogue, MS, [facility] Genetic Counselor [email or web address] Phone: [phone]

## 2022-10-22 ENCOUNTER — Telehealth: Payer: Self-pay | Admitting: Obstetrics and Gynecology

## 2022-10-22 DIAGNOSIS — Z803 Family history of malignant neoplasm of breast: Secondary | ICD-10-CM

## 2022-10-22 DIAGNOSIS — Z9189 Other specified personal risk factors, not elsewhere classified: Secondary | ICD-10-CM

## 2022-10-22 NOTE — Telephone Encounter (Signed)
Please make a referral to Dr. Lindi Adie, Medical Oncology at the [facility].   My patient has a history of two sisters with breast cancer, and the patient has had negative genetic testing.   The patient's lifetime risk of breast cancer is 19.7%.  Diagnosis is family history of breast cancer.   (The patient is going to be scheduled for an upcoming laparoscopic bilateral salpingectomy with right oophorectomy due to complex right ovarian cysts.)

## 2022-10-22 NOTE — Telephone Encounter (Signed)
Please precert and schedule laparoscopic right oophorectomy and bilateral salpingectomy with collection of pelvic washings.  Patient has complex right ovarian cysts.   Surgery will be at [facility].   Time needed is 1.5 hours.   Assistant needed.

## 2022-10-23 ENCOUNTER — Ambulatory Visit
Admission: RE | Admit: 2022-10-23 | Discharge: 2022-10-23 | Disposition: A | Discharge status: Home / Self Care | Payer: BC Managed Care – PPO | Source: Ambulatory Visit

## 2022-10-23 DIAGNOSIS — Z1231 Encounter for screening mammogram for malignant neoplasm of breast: Secondary | ICD-10-CM

## 2022-10-23 NOTE — Telephone Encounter (Signed)
Referral placed.   Routing to [facility] to schedule.

## 2022-10-30 ENCOUNTER — Telehealth: Payer: Self-pay | Admitting: Hematology and Oncology

## 2022-10-30 NOTE — Telephone Encounter (Signed)
Scheduled appointment per 11/07. Patient is aware of appointment date and time. Patient is aware to arrive 15 mins prior to appointment time and to bring updated insurance cards. Patient is aware of location.

## 2022-11-05 NOTE — Telephone Encounter (Signed)
Has appointment on 11/27/22.  Encounter closed.

## 2022-11-06 ENCOUNTER — Encounter: Payer: Self-pay | Admitting: Obstetrics and Gynecology

## 2022-11-06 NOTE — Telephone Encounter (Signed)
Left message to call Nevan Creighton, RN at [facility], [phone].  

## 2022-11-06 NOTE — Telephone Encounter (Signed)
See open telephone encounter dated 10/22/22.   Encounter closed.

## 2022-11-07 NOTE — Telephone Encounter (Signed)
Spoke with patient. Reviewed surgery dates. Patient request to proceed with surgery on 11/27/22.  Advised patient I will forward to business office for return call. I will return call once surgery date and time confirmed. Patient verbalizes understanding and is agreeable.   Surgery request sent.

## 2022-11-08 DIAGNOSIS — G4733 Obstructive sleep apnea (adult) (pediatric): Secondary | ICD-10-CM | POA: Diagnosis not present

## 2022-11-08 DIAGNOSIS — F5104 Psychophysiologic insomnia: Secondary | ICD-10-CM | POA: Diagnosis not present

## 2022-11-12 ENCOUNTER — Telehealth: Payer: Self-pay | Admitting: Obstetrics and Gynecology

## 2022-11-12 NOTE — Telephone Encounter (Signed)
Spoke with patient. Surgery date request confirmed.  Advised surgery is scheduled for [facility] on 11/27/22 at 1000.  Surgery instruction sheet and hospital brochure reviewed, printed copy will be mailed.  Patient verbalizes understanding and is agreeable.   Pre-op scheduled for 11/21/22.   Patient notified of benefits per [place].  Routing to provider. Encounter closed.

## 2022-11-12 NOTE — Telephone Encounter (Signed)
Please schedule preop visit with me.  Thank you.

## 2022-11-12 NOTE — Telephone Encounter (Signed)
Nunzio Cobbs, MD routed conversation to You14 minutes ago (12:22 PM)   Yisroel Ramming, Dietrich Pates E, MD14 minutes ago (12:22 PM)    Please schedule preop visit with me.  Thank you.

## 2022-11-12 NOTE — Telephone Encounter (Signed)
Spoke with patient. She is unable to talk at this time, will return call later today.

## 2022-11-12 NOTE — Telephone Encounter (Signed)
See open telephone encounter dated 10/22/22.   Encounter closed.

## 2022-11-13 ENCOUNTER — Telehealth: Payer: Self-pay | Admitting: Hematology and Oncology

## 2022-11-13 NOTE — Progress Notes (Signed)
GYNECOLOGY  VISIT   HPI: 51 y.o.   Married  Caucasian  female   G2P2002 with No LMP recorded (lmp unknown). (Menstrual status: IUD).   here for   pre-op.  She has a right ovarian cyst increasing in size.   US done on 08/23/22 to check for IUD due to absent threads on pelvic exam showed: Uterus 8.81 x 5.96 x 4.68 cm.  No myometrial masses.  EMS 2.38 mm. IUD in proper position in uterine cavity.  String 1 cm from external os.  Left ovary 2.94 x 1.19 x 0.87 cm.  Right ovary 4.67 x 3.65 x 2.50 cm.   2 cysts:  3.8 x 3.4 x 2.1 cm, avascular with 2 septations.                2.1 x 1.2 cm with 4 mm solid component along internal wall.  No free fluid.   Labs on 08/23/22: CA125 11. FSH 13.1 Estradiol 293.  Her follow up ultrasound on 10/18/22 showed:  Uterus 8.42 x 6.11 x 4.78 mm   No myometrial masses.  EMS 5.23 mm.  IUD in endometrial canal.  Left ovary 3.11 x 1.72 x 0.79 cm.  Right ovary 5.74 x 4.04 x 3.28 cm.  2 cysts:  3.8 x 3.4 x 2.8 cm, slightly larger.  Septation noted. 2.16 x 1.36 cm cyst with solid component shows no significant change in size or appearance. No free fluid.   No health changes since her last office visit.   GYNECOLOGIC HISTORY: No LMP recorded (lmp unknown). (Menstrual status: IUD). Contraception:  IUD, Mirena 01/17/17 Menopausal hormone therapy:  n/a Last mammogram:  10/23/22, Breast Density Category B, BI-RADS CATEGORY 1 Negative Last pap smear:   07/18/22 negative: negative HR HPV, 09-30-14 Neg:Neg HR HPV          OB History     Gravida  2   Para  2   Term  2   Preterm      AB      Living  2      SAB      IAB      Ectopic      Multiple      Live Births                 Patient Active Problem List   Diagnosis Date Noted   Genetic testing 10/17/2022   Hypothyroidism 09/30/2014    Past Medical History:  Diagnosis Date   Encounter for insertion of mirena IUD 11/09/2011   History of migraine headaches    no aura   Hyperlipidemia  09/30/14   Hypothyroidism 09/30/2014   Thyroid disease     Past Surgical History:  Procedure Laterality Date   CESAREAN SECTION  x2   NASAL SEPTUM SURGERY     NECK SURGERY     TONSILLECTOMY      Current Outpatient Medications  Medication Sig Dispense Refill   FLUoxetine (PROZAC) 10 MG capsule Take by mouth.     ibuprofen (ADVIL,MOTRIN) 200 MG tablet Take 400-800 mg by mouth daily as needed for headache or moderate pain.      LORazepam (ATIVAN) 0.5 MG tablet Take 0.5 mg by mouth daily as needed for anxiety.   0   pantoprazole (PROTONIX) 40 MG tablet Take 40 mg by mouth daily as needed (indigestion).      rosuvastatin (CRESTOR) 20 MG tablet Take 20 mg by mouth at bedtime.     SYNTHROID 112 MCG tablet  Take by mouth.     triamcinolone cream (KENALOG) 0.1 % APPLY 1 APPLICATION EXTERNALLY TO THE AFFECTED AREA TWICE A DAY     No current facility-administered medications for this visit.     ALLERGIES: Patient has no known allergies.  Family History  Problem Relation Age of Onset   Heart attack Mother    Stroke Mother    Stomach cancer Father 64   Breast cancer Sister 84   Colon cancer Sister 27   Uterine cancer Sister        vs ovarian   Breast cancer Sister 76   Stomach cancer Paternal Aunt    Stomach cancer Paternal Grandmother 47    Social History   Socioeconomic History   Marital status: Married    Spouse name: Not on file   Number of children: Not on file   Years of education: Not on file   Highest education level: Not on file  Occupational History   Not on file  Tobacco Use   Smoking status: Never   Smokeless tobacco: Never  Vaping Use   Vaping Use: Never used  Substance and Sexual Activity   Alcohol use: No   Drug use: No   Sexual activity: Yes    Partners: Male    Birth control/protection: I.U.D.    Comment: Mirena IUD 01-24-17  Other Topics Concern   Not on file  Social History Narrative   Lives with spouse   Works in [employer]   Caffeine: 2+  servings daily, soda   2 children - 1 boy, 1 girl   Secretary/administrator grad   Social Determinants of Health   Financial Resource Strain: Not on file  Food Insecurity: Not on file  Transportation Needs: Not on file  Physical Activity: Not on file  Stress: Not on file  Social Connections: Not on file  Intimate Partner Violence: Not on file    Review of Systems  All other systems reviewed and are negative.   PHYSICAL EXAMINATION:    BP 118/80 (BP Location: Right Arm, Patient Position: Sitting, Cuff Size: Normal)   Ht '5\' 6"'$  (1.676 m)   Wt 177 lb (80.3 kg)   LMP  (LMP Unknown)   BMI 28.57 kg/m     General appearance: alert, cooperative and appears stated age Head: Normocephalic, without obvious abnormality, atraumatic Neck: no adenopathy, supple, symmetrical, trachea midline and thyroid normal to inspection and palpation Lungs: clear to auscultation bilaterally Heart: regular rate and rhythm Abdomen: soft, non-tender, no masses,  no organomegaly Extremities: extremities normal, atraumatic, no cyanosis or edema Skin: Skin color, texture, turgor normal. No rashes or lesions No abnormal inguinal nodes palpated Neurologic: Grossly normal  Pelvic: External genitalia:  no lesions              Urethra:  normal appearing urethra with no masses, tenderness or lesions              Bartholins and Skenes: normal                 Vagina: normal appearing vagina with normal color and discharge, no lesions              Cervix: no lesions.  IUDstrings not seen                Bimanual Exam:  Uterus:  normal size, contour, position, consistency, mobility, non-tender              Adnexa: fullness right, nontender.  no mass,  fullness, tenderness on left.         Chaperone was present for exam:  Emilly  ASSESSMENT  Enlarging complex right ovary. Mirena IUD.  Proper position by pelvic US. FH breast and colon cancer.  Negative genetic testing.  19.7% lifetime risk of breast cancer.  PLAN  Proceed  with laparoscopic right oophorectomy, bilateral salpingectomy, collection of pelvic washings.  Risks, benefits, and alternatives discussed with the patient who wishes to proceed.   An After Visit Summary was printed and given to the patient.

## 2022-11-13 NOTE — Telephone Encounter (Signed)
Patient called to r/s upcoming appointment. Patient r/s and notified.

## 2022-11-16 DIAGNOSIS — Z800 Family history of malignant neoplasm of digestive organs: Secondary | ICD-10-CM | POA: Diagnosis not present

## 2022-11-16 DIAGNOSIS — Z1211 Encounter for screening for malignant neoplasm of colon: Secondary | ICD-10-CM | POA: Diagnosis not present

## 2022-11-16 DIAGNOSIS — K648 Other hemorrhoids: Secondary | ICD-10-CM | POA: Diagnosis not present

## 2022-11-21 ENCOUNTER — Encounter: Payer: Self-pay | Admitting: Obstetrics and Gynecology

## 2022-11-21 ENCOUNTER — Ambulatory Visit: Payer: BC Managed Care – PPO | Admitting: Obstetrics and Gynecology

## 2022-11-21 VITALS — BP 118/80 | Ht 66.0 in | Wt 177.0 lb

## 2022-11-21 DIAGNOSIS — N83201 Unspecified ovarian cyst, right side: Secondary | ICD-10-CM

## 2022-11-23 ENCOUNTER — Telehealth: Payer: Self-pay | Admitting: Obstetrics and Gynecology

## 2022-11-23 NOTE — Telephone Encounter (Signed)
Left message for patient to call.

## 2022-11-23 NOTE — Telephone Encounter (Signed)
Please contact patient in preparation for her surgery with me next week.   She is having a laparoscopic right oophorectomy and bilateral salpingectomy with collection of pelvic washings.   She has a Mirena IUD in place for about 6 years.   Once the tubal ligation is done, she will not need contraception The tubal removal is permanent sterilization.   She is not menopausal yet.   I would like to have confirmation from her, does she want be to remove the Mirena IUD at the time of her surgery?

## 2022-11-26 ENCOUNTER — Encounter (HOSPITAL_BASED_OUTPATIENT_CLINIC_OR_DEPARTMENT_OTHER): Payer: Self-pay | Admitting: Obstetrics and Gynecology

## 2022-11-26 ENCOUNTER — Other Ambulatory Visit: Payer: Self-pay

## 2022-11-26 NOTE — Telephone Encounter (Signed)
Left message on voicemail that I would send my chart message patient works for [employer].

## 2022-11-26 NOTE — Anesthesia Preprocedure Evaluation (Addendum)
Anesthesia Evaluation  Patient identified by MRN, date of birth, ID band Patient awake    Reviewed: Allergy & Precautions, NPO status , Patient's Chart, lab work & pertinent test results  Airway Mallampati: II  TM Distance: >3 FB Neck ROM: Full    Dental no notable dental hx.    Pulmonary neg pulmonary ROS   Pulmonary exam normal        Cardiovascular negative cardio ROS  Rhythm:Regular Rate:Normal     Neuro/Psych  Headaches  Anxiety        GI/Hepatic Neg liver ROS,GERD  Medicated,,  Endo/Other  Hypothyroidism    Renal/GU negative Renal ROS  Female GU complaint     Musculoskeletal negative musculoskeletal ROS (+)    Abdominal Normal abdominal exam  (+)   Peds  Hematology negative hematology ROS (+)   Anesthesia Other Findings   Reproductive/Obstetrics                             Anesthesia Physical Anesthesia Plan  ASA: 2  Anesthesia Plan: General   Post-op Pain Management: Celebrex PO (pre-op)* and Tylenol PO (pre-op)*   Induction: Intravenous  PONV Risk Score and Plan: 3 and Ondansetron, Dexamethasone, Midazolam, Scopolamine patch - Pre-op and Treatment may vary due to age or medical condition  Airway Management Planned: Mask and Oral ETT  Additional Equipment: None  Intra-op Plan:   Post-operative Plan: Extubation in OR  Informed Consent: I have reviewed the patients History and Physical, chart, labs and discussed the procedure including the risks, benefits and alternatives for the proposed anesthesia with the patient or authorized representative who has indicated his/her understanding and acceptance.     Dental advisory given  Plan Discussed with: CRNA  Anesthesia Plan Comments:        Anesthesia Quick Evaluation

## 2022-11-26 NOTE — Telephone Encounter (Signed)
Encounter removed and closed.

## 2022-11-26 NOTE — Progress Notes (Signed)
Spoke w/ via phone for pre-op interview---Brianna [name] needs dos---- urine pregnancy per anesthesia, surgeon orders pending as of 11/26/22             [name] results------none COVID test -----patient states asymptomatic no test needed Arrive at -------0815 on Tuesday, 11/27/22 NPO after MN NO Solid Food.  Clear liquids from MN until---0715 Med rec completed Medications to take morning of surgery -----Prozac, Protonix, Synthroid, Ativan prn Diabetic medication -----n/a Patient instructed no nail polish to be worn day of surgery Patient instructed to bring photo id and insurance card day of surgery Patient aware to have Driver (ride ) / caregiver    for 24 hours after surgery - husband, Copywriter, advertising Patient Special Instructions -----Bring CPAP and supplies on day of surgery. Pre-Op special Istructions -----Requested orders from Dr. Quincy Simmonds on 11/22/22 via Epic IB. Patient verbalized understanding of instructions that were given at this phone interview. Patient denies shortness of breath, chest pain, fever, cough at this phone interview.

## 2022-11-27 ENCOUNTER — Encounter: Payer: BC Managed Care – PPO | Admitting: Hematology and Oncology

## 2022-11-27 ENCOUNTER — Other Ambulatory Visit: Payer: Self-pay

## 2022-11-27 ENCOUNTER — Ambulatory Visit (HOSPITAL_BASED_OUTPATIENT_CLINIC_OR_DEPARTMENT_OTHER): Payer: BC Managed Care – PPO | Admitting: Anesthesiology

## 2022-11-27 ENCOUNTER — Encounter (HOSPITAL_BASED_OUTPATIENT_CLINIC_OR_DEPARTMENT_OTHER): Payer: Self-pay | Admitting: Obstetrics and Gynecology

## 2022-11-27 ENCOUNTER — Encounter (HOSPITAL_BASED_OUTPATIENT_CLINIC_OR_DEPARTMENT_OTHER)
Admission: RE | Disposition: A | Discharge status: Home / Self Care | Payer: Self-pay | Source: Ambulatory Visit | Attending: Obstetrics and Gynecology

## 2022-11-27 ENCOUNTER — Ambulatory Visit (HOSPITAL_BASED_OUTPATIENT_CLINIC_OR_DEPARTMENT_OTHER)
Admission: RE | Admit: 2022-11-27 | Discharge: 2022-11-27 | Disposition: A | Discharge status: Home / Self Care | Payer: BC Managed Care – PPO | Source: Ambulatory Visit | Attending: Obstetrics and Gynecology | Admitting: Obstetrics and Gynecology

## 2022-11-27 DIAGNOSIS — K219 Gastro-esophageal reflux disease without esophagitis: Secondary | ICD-10-CM | POA: Diagnosis not present

## 2022-11-27 DIAGNOSIS — D270 Benign neoplasm of right ovary: Secondary | ICD-10-CM | POA: Diagnosis not present

## 2022-11-27 DIAGNOSIS — G4733 Obstructive sleep apnea (adult) (pediatric): Secondary | ICD-10-CM | POA: Diagnosis not present

## 2022-11-27 DIAGNOSIS — R519 Headache, unspecified: Secondary | ICD-10-CM | POA: Insufficient documentation

## 2022-11-27 DIAGNOSIS — Z30432 Encounter for removal of intrauterine contraceptive device: Secondary | ICD-10-CM | POA: Insufficient documentation

## 2022-11-27 DIAGNOSIS — N83201 Unspecified ovarian cyst, right side: Secondary | ICD-10-CM

## 2022-11-27 DIAGNOSIS — Z302 Encounter for sterilization: Secondary | ICD-10-CM | POA: Diagnosis not present

## 2022-11-27 DIAGNOSIS — R5383 Other fatigue: Secondary | ICD-10-CM | POA: Diagnosis not present

## 2022-11-27 DIAGNOSIS — Z01818 Encounter for other preprocedural examination: Secondary | ICD-10-CM

## 2022-11-27 DIAGNOSIS — N736 Female pelvic peritoneal adhesions (postinfective): Secondary | ICD-10-CM | POA: Insufficient documentation

## 2022-11-27 DIAGNOSIS — R001 Bradycardia, unspecified: Secondary | ICD-10-CM | POA: Diagnosis not present

## 2022-11-27 DIAGNOSIS — R9431 Abnormal electrocardiogram [ECG] [EKG]: Secondary | ICD-10-CM | POA: Diagnosis not present

## 2022-11-27 DIAGNOSIS — E039 Hypothyroidism, unspecified: Secondary | ICD-10-CM | POA: Insufficient documentation

## 2022-11-27 DIAGNOSIS — N83291 Other ovarian cyst, right side: Secondary | ICD-10-CM | POA: Insufficient documentation

## 2022-11-27 DIAGNOSIS — N8301 Follicular cyst of right ovary: Secondary | ICD-10-CM | POA: Diagnosis not present

## 2022-11-27 DIAGNOSIS — F419 Anxiety disorder, unspecified: Secondary | ICD-10-CM | POA: Diagnosis not present

## 2022-11-27 HISTORY — DX: Anxiety disorder, unspecified: F41.9

## 2022-11-27 HISTORY — DX: Gastro-esophageal reflux disease without esophagitis: K21.9

## 2022-11-27 HISTORY — PX: LAPAROSCOPIC LYSIS OF ADHESIONS: SHX5905

## 2022-11-27 HISTORY — PX: LAPAROSCOPIC BILATERAL SALPINGECTOMY: SHX5889

## 2022-11-27 LAB — CBC
HCT: 41 % (ref 36.0–46.0)
Hemoglobin: 13.6 g/dL (ref 12.0–15.0)
MCH: 29.6 pg (ref 26.0–34.0)
MCHC: 33.2 g/dL (ref 30.0–36.0)
MCV: 89.3 fL (ref 80.0–100.0)
Platelets: 210 10*3/uL (ref 150–400)
RBC: 4.59 MIL/uL (ref 3.87–5.11)
RDW: 13.5 % (ref 11.5–15.5)
WBC: 5.7 10*3/uL (ref 4.0–10.5)
nRBC: 0 % (ref 0.0–0.2)

## 2022-11-27 LAB — BASIC METABOLIC PANEL
Anion gap: 6 (ref 5–15)
BUN: <5 mg/dL — ABNORMAL LOW (ref 6–20)
CO2: 25 mmol/L (ref 22–32)
Calcium: 8.9 mg/dL (ref 8.9–10.3)
Chloride: 107 mmol/L (ref 98–111)
Creatinine, Ser: 0.59 mg/dL (ref 0.44–1.00)
GFR, Estimated: >60 mL/min (ref >60)
Glucose, Bld: 100 mg/dL — ABNORMAL HIGH (ref 70–99)
Potassium: 3.4 mmol/L — ABNORMAL LOW (ref 3.5–5.1)
Sodium: 138 mmol/L (ref 135–145)

## 2022-11-27 LAB — POCT PREGNANCY, URINE: Preg Test, Ur: NEGATIVE

## 2022-11-27 SURGERY — SALPINGECTOMY, BILATERAL, LAPAROSCOPIC
Anesthesia: General | Site: Abdomen

## 2022-11-27 MED ORDER — BUPIVACAINE HCL (PF) 0.25 % IJ SOLN
INTRAMUSCULAR | Status: DC | PRN
  Administered 2022-11-27: 20 mL

## 2022-11-27 MED ORDER — ACETAMINOPHEN 500 MG PO TABS
ORAL_TABLET | ORAL | Status: AC
  Filled 2022-11-27: qty 2

## 2022-11-27 MED ORDER — FENTANYL CITRATE (PF) 100 MCG/2ML IJ SOLN
INTRAMUSCULAR | Status: DC | PRN
  Administered 2022-11-27 (×2): 50 ug via INTRAVENOUS

## 2022-11-27 MED ORDER — FENTANYL CITRATE (PF) 100 MCG/2ML IJ SOLN
25.0000 ug | INTRAMUSCULAR | Status: DC | PRN
  Administered 2022-11-27: 25 ug via INTRAVENOUS
  Administered 2022-11-27: 50 ug via INTRAVENOUS
  Administered 2022-11-27: 25 ug via INTRAVENOUS
  Administered 2022-11-27: 50 ug via INTRAVENOUS

## 2022-11-27 MED ORDER — HYDROMORPHONE HCL 1 MG/ML IJ SOLN
0.5000 mg | INTRAMUSCULAR | Status: DC | PRN
  Administered 2022-11-27 (×2): 0.5 mg via INTRAVENOUS

## 2022-11-27 MED ORDER — CELECOXIB 200 MG PO CAPS
200.0000 mg | ORAL_CAPSULE | Freq: Once | ORAL | Status: AC
  Administered 2022-11-27: 200 mg via ORAL

## 2022-11-27 MED ORDER — LACTATED RINGERS IV SOLN: INTRAVENOUS | Status: DC

## 2022-11-27 MED ORDER — SCOPOLAMINE 1 MG/3DAYS TD PT72
1.0000 | MEDICATED_PATCH | TRANSDERMAL | Status: DC
  Administered 2022-11-27: 1.5 mg via TRANSDERMAL

## 2022-11-27 MED ORDER — ACETAMINOPHEN 500 MG PO TABS
1000.0000 mg | ORAL_TABLET | ORAL | Status: AC
  Administered 2022-11-27: 1000 mg via ORAL

## 2022-11-27 MED ORDER — ROCURONIUM BROMIDE 10 MG/ML (PF) SYRINGE
PREFILLED_SYRINGE | INTRAVENOUS | Status: DC | PRN
  Administered 2022-11-27: 50 mg via INTRAVENOUS

## 2022-11-27 MED ORDER — POVIDONE-IODINE 10 % EX SWAB: 2.0000 | Freq: Once | CUTANEOUS | Status: DC

## 2022-11-27 MED ORDER — OXYCODONE-ACETAMINOPHEN 5-325 MG PO TABS: 1.0000 | ORAL_TABLET | ORAL | 0 refills | Status: AC | PRN

## 2022-11-27 MED ORDER — LIDOCAINE 2% (20 MG/ML) 5 ML SYRINGE
INTRAMUSCULAR | Status: DC | PRN
  Administered 2022-11-27: 60 mg via INTRAVENOUS

## 2022-11-27 MED ORDER — MIDAZOLAM HCL 2 MG/2ML IJ SOLN
INTRAMUSCULAR | Status: AC
  Filled 2022-11-27: qty 2

## 2022-11-27 MED ORDER — HYDROMORPHONE HCL 1 MG/ML IJ SOLN
INTRAMUSCULAR | Status: AC
  Filled 2022-11-27: qty 1

## 2022-11-27 MED ORDER — AMISULPRIDE (ANTIEMETIC) 5 MG/2ML IV SOLN: 10.0000 mg | Freq: Once | INTRAVENOUS | Status: DC | PRN

## 2022-11-27 MED ORDER — FENTANYL CITRATE (PF) 100 MCG/2ML IJ SOLN
INTRAMUSCULAR | Status: AC
  Filled 2022-11-27: qty 2

## 2022-11-27 MED ORDER — PROPOFOL 10 MG/ML IV BOLUS
INTRAVENOUS | Status: DC | PRN
  Administered 2022-11-27: 150 mg via INTRAVENOUS

## 2022-11-27 MED ORDER — DEXAMETHASONE SODIUM PHOSPHATE 10 MG/ML IJ SOLN
INTRAMUSCULAR | Status: DC | PRN
  Administered 2022-11-27: 5 mg via INTRAVENOUS

## 2022-11-27 MED ORDER — PROPOFOL 10 MG/ML IV BOLUS
INTRAVENOUS | Status: AC
  Filled 2022-11-27: qty 20

## 2022-11-27 MED ORDER — OXYCODONE HCL 5 MG/5ML PO SOLN: 5.0000 mg | Freq: Once | ORAL | Status: AC | PRN

## 2022-11-27 MED ORDER — OXYCODONE HCL 5 MG PO TABS
5.0000 mg | ORAL_TABLET | Freq: Once | ORAL | Status: AC | PRN
  Administered 2022-11-27: 5 mg via ORAL

## 2022-11-27 MED ORDER — OXYCODONE HCL 5 MG PO TABS
ORAL_TABLET | ORAL | Status: AC
  Filled 2022-11-27: qty 1

## 2022-11-27 MED ORDER — IBUPROFEN 800 MG PO TABS: 800.0000 mg | ORAL_TABLET | Freq: Every day | ORAL | 1 refills | Status: DC | PRN

## 2022-11-27 MED ORDER — SUGAMMADEX SODIUM 200 MG/2ML IV SOLN
INTRAVENOUS | Status: DC | PRN
  Administered 2022-11-27: 200 mg via INTRAVENOUS

## 2022-11-27 MED ORDER — MIDAZOLAM HCL 2 MG/2ML IJ SOLN
INTRAMUSCULAR | Status: DC | PRN
  Administered 2022-11-27: 2 mg via INTRAVENOUS

## 2022-11-27 MED ORDER — ONDANSETRON HCL 4 MG/2ML IJ SOLN
INTRAMUSCULAR | Status: DC | PRN
  Administered 2022-11-27: 4 mg via INTRAVENOUS

## 2022-11-27 MED ORDER — 0.9 % SODIUM CHLORIDE (POUR BTL) OPTIME
TOPICAL | Status: DC | PRN
  Administered 2022-11-27: 500 mL

## 2022-11-27 MED ORDER — HEMOSTATIC AGENTS (NO CHARGE) OPTIME
TOPICAL | Status: DC | PRN
  Administered 2022-11-27: 1

## 2022-11-27 MED ORDER — CELECOXIB 200 MG PO CAPS
ORAL_CAPSULE | ORAL | Status: AC
  Filled 2022-11-27: qty 1

## 2022-11-27 MED ORDER — SODIUM CHLORIDE 0.9 % IR SOLN
Status: DC | PRN
  Administered 2022-11-27: 1000 mL

## 2022-11-27 MED ORDER — SCOPOLAMINE 1 MG/3DAYS TD PT72
MEDICATED_PATCH | TRANSDERMAL | Status: AC
  Filled 2022-11-27: qty 1

## 2022-11-27 SURGICAL SUPPLY — 43 items
ADH SKN CLS APL DERMABOND .7 (GAUZE/BANDAGES/DRESSINGS) ×1
APL ESCP 73.6OZ SRGCL (TIP) ×1
CABLE HIGH FREQUENCY MONO STRZ (ELECTRODE) IMPLANT
DERMABOND ADVANCED .7 DNX12 (GAUZE/BANDAGES/DRESSINGS) ×1 IMPLANT
DRSG COVADERM PLUS 2X2 (GAUZE/BANDAGES/DRESSINGS) IMPLANT
DRSG OPSITE POSTOP 3X4 (GAUZE/BANDAGES/DRESSINGS) IMPLANT
DURAPREP 26ML APPLICATOR (WOUND CARE) ×1 IMPLANT
GLOVE BIO SURGEON STRL SZ 6.5 (GLOVE) ×2 IMPLANT
GLOVE BIOGEL PI IND STRL 6.5 (GLOVE) IMPLANT
GOWN STRL REUS W/TWL LRG LVL3 (GOWN DISPOSABLE) ×2 IMPLANT
HOLDER FOLEY CATH W/STRAP (MISCELLANEOUS) ×1 IMPLANT
IV NS 1000ML (IV SOLUTION) ×1
IV NS 1000ML BAXH (IV SOLUTION) IMPLANT
KIT TURNOVER CYSTO (KITS) ×1 IMPLANT
LIGASURE VESSEL 5MM BLUNT TIP (ELECTROSURGICAL) IMPLANT
NDL INSUFFLATION 14GA 120MM (NEEDLE) ×1 IMPLANT
NEEDLE INSUFFLATION 14GA 120MM (NEEDLE) ×1 IMPLANT
NS IRRIG 500ML POUR BTL (IV SOLUTION) IMPLANT
PACK LAPAROSCOPY BASIN (CUSTOM PROCEDURE TRAY) ×1 IMPLANT
PACK TRENDGUARD 450 HYBRID PRO (MISCELLANEOUS) ×1 IMPLANT
POUCH LAPAROSCOPIC INSTRUMENT (MISCELLANEOUS) ×1 IMPLANT
POWDER SURGICEL 3.0 GRAM (HEMOSTASIS) IMPLANT
PROTECTOR NERVE ULNAR (MISCELLANEOUS) ×2 IMPLANT
SCISSORS LAP 5X35 DISP (ENDOMECHANICALS) IMPLANT
SET SUCTION IRRIG HYDROSURG (IRRIGATION / IRRIGATOR) ×1 IMPLANT
SET TUBE SMOKE EVAC HIGH FLOW (TUBING) ×1 IMPLANT
SHEARS 1100 HARMONIC 36 (ELECTROSURGICAL) IMPLANT
SLEEVE ADV FIXATION 5X100MM (TROCAR) IMPLANT
SUT VIC AB 4-0 PS2 27 (SUTURE) IMPLANT
SUT VIC AB 4-0 SH 27 (SUTURE) ×1
SUT VIC AB 4-0 SH 27XANBCTRL (SUTURE) IMPLANT
SUT VICRYL 0 UR6 27IN ABS (SUTURE) IMPLANT
SYS BAG RETRIEVAL 10MM (BASKET) ×1
SYSTEM BAG RETRIEVAL 10MM (BASKET) IMPLANT
SYSTEM CARTER THOMASON II (TROCAR) IMPLANT
TIP ENDOSCOPIC SURGICEL (TIP) IMPLANT
TOWEL OR 17X26 10 PK STRL BLUE (TOWEL DISPOSABLE) ×1 IMPLANT
TRAY FOLEY W/BAG SLVR 14FR LF (SET/KITS/TRAYS/PACK) IMPLANT
TRENDGUARD 450 HYBRID PRO PACK (MISCELLANEOUS) ×1
TROCAR ADV FIXATION 5X100MM (TROCAR) ×1 IMPLANT
TROCAR Z-THREAD FIOS 11X100 BL (TROCAR) IMPLANT
TROCAR Z-THREAD FIOS 5X100MM (TROCAR) ×1 IMPLANT
WARMER LAPAROSCOPE (MISCELLANEOUS) ×1 IMPLANT

## 2022-11-27 NOTE — Progress Notes (Signed)
Update to History and Physical  No marked change in status since office preop visit.  Patient does desire permanent sterilization with the bilateral salpingectomy and she desires removal of her IUD at the time of her laparoscopic right oophorectomy with collection of pelvic washings.   Vitals:   11/27/22 0854  BP: 123/80  Pulse: 73  Resp: 17  Temp: (!) 97.5 F (36.4 C)  SpO2: 99%    Patient examined.   CBC    Component Value Date/Time   WBC 5.7 11/27/2022 0902   RBC 4.59 11/27/2022 0902   HGB 13.6 11/27/2022 0902   HGB 14.6 09/30/2014 1417   HCT 41.0 11/27/2022 0902   PLT 210 11/27/2022 0902   MCV 89.3 11/27/2022 0902   MCH 29.6 11/27/2022 0902   MCHC 33.2 11/27/2022 0902   RDW 13.5 11/27/2022 0902   LYMPHSABS 2.3 12/30/2015 1640   MONOABS 0.7 12/30/2015 1640   EOSABS 0.2 12/30/2015 1640   BASOSABS 0.1 12/30/2015 1640   UPT negative.  OK to proceed with surgery:  laparoscopic right oophorectomy with bilateral salpingectomy, collection of pelvic washings, and removal of IUD.  She understands that there is a risk of laparotomy and also use of a hysteroscope to remove her IUD if necessary.

## 2022-11-27 NOTE — Transfer of Care (Signed)
Immediate Anesthesia Transfer of Care Note  Patient: Brianna [name]  Procedure(s) Performed: LAPAROSCOPIC BILATERAL SALPINGECTOMY, right oophorectomy with collection of pelvic washings (Abdomen) LAPAROSCOPIC LYSIS OF ADHESIONS (Abdomen)  Patient Location: PACU  Anesthesia Type:General  Level of Consciousness: awake, alert , oriented, and patient cooperative  Airway & Oxygen Therapy: Patient Spontanous Breathing and Patient connected to nasal cannula oxygen  Post-op Assessment: Report given to RN and Post -op Vital signs reviewed and stable  Post vital signs: Reviewed and stable  Last Vitals:  Vitals Value Taken Time  BP 97/68 11/27/22 1136  Temp    Pulse 67 11/27/22 1138  Resp 14 11/27/22 1138  SpO2 93 % 11/27/22 1138  Vitals shown include unvalidated device data.  Last Pain:  Vitals:   11/27/22 0854  TempSrc: Oral  PainSc: 0-No pain      Patients Stated Pain Goal: 7 (02/07/30 4388)  Complications: No notable events documented.

## 2022-11-27 NOTE — Op Note (Signed)
OPERATIVE REPORT  PREOPERATIVE DIAGNOSES:   Right ovarian cyst, desire for permanent sterilization, desire for removal of Mirena IUD.  POSTOPERATIVE DIAGNOSES:   Right paraovarian cyst, desire for permanent sterilization, pelvic adhesions, desire for removal of Mirena IUD.   PROCEDURES:   Laparoscopic right oophorectomy, bilateral salpingectomy, collection of pelvic washings, lysis of adhesions, removal of Mirena IUD.  SURGEON:  Lenard Galloway, M.D.  ASSISTANT:  Dorothy Spark, M.D.    ANESTHESIA:  General endotracheal, local with 0.25% Marcaine, IV Tylenol.  EBL:   10 cc  URINE OUTPUT:    100 cc  IV FLUIDS:     149 cc  COMPLICATIONS:  None.  INDICATIONS FOR THE PROCEDURE:  The patient is a 51 year old Gravida 2, 69 2 Caucasian female who presented with an IUD with lost strings on pelvic exam.  Ultrasound documented the IUD to be in a normal position and she had a complex right ovarian cyst.  Follow up ultrasound showed the cyst to be enlarging in size, and she desired permanent sterilization.  Her CA125 was normal.  A plan is made for a laparoscopic right oophorectomy, bilateral salpingectomy, collection of pelvic washings, and removal her Mirena IUD.  Risks, benefits, and alternatives are reviewed with the patient, who wishes to proceed.  FINDINGS:  Examination under anesthesia revealed a small uterus and no adnexal masses.  Her IUD strings were not visible or palpable.  Laparoscopy demonstrated a normal uterus. She had adhesions around the bilateral tubes and ovaries. There was a right para-ovarian cyst approximately 2.5 cm in size.  The right tube was adherent to the right ovary. The left tube was adherent to the sigmoid colon and the left pelvic sidewall.  The liver and gall bladder appeared normal.  Omental adhesions were present to the anterior abdominal wall below the umbilicus.    SPECIMENS:  The bilateral tubes and right ovary were sent to pathology, separately from the  pelvic washings.  The Mirena IUD was discarded.  DESCRIPTION OF PROCEDURE:  The patient was reidentified in the preoperative hold area.  She received TED hose and PAS stockings for DVT prophylaxis.  The patient was transferred to the operating room where she was placed in the dorsal lithotomy position with Allen stirrups.  General endotracheal anesthesia was induced.  The Trendgard was used to support the patient on the OR table.   An examination under anesthesia was performed.  The patient's lower abdomen, vagina and perineum were then sterilely prepped and she was draped.    A speculum was placed in the vagina and a tenaculum was placed on the anterior cervical lip. The cervix was dilated with Kennon Rounds dilators.  A long Kelly clamp was introduced into cervix, and the IUD strings were grasped in order to remove the IUD.  The IUD was intact, and it was discarded.   A Hulka tenaculum was placed in the uterine cavity, and the anterior cervical tenaculum and speculum were removed.   A Foley catheter was sterilely placed inside the bladder and left to gravity drainage throughout the procedure and was removed at the termination of the procedure.    Attention was turned to the abdomen where the umbilical region was injected with 0.25% Marcaine and a small incision created.  Penetrating towel clips were used to raise the anterior abdominal wall.  A Veress needle was then used to insufflate the abdomen with CO2 gas after a saline drop test was performed and the fluid flowed freely.   A  5 mm camera port was then placed using the Optiview.  A 5 mm incision was created one in the left mid quadrant and an 11 mm incision was created in the right mid quadrant after the skin was injected locally with 0.25% Marcaine.  Respective sized trocars were then placed under visualization of the laparoscope.      The patient was placed in Trendelenburg position.  An inspection of the abdomen and pelvis was performed. The  findings are as noted above.   Pelvic washings were obtained and sent to Pathology.   The omental adhesions to the anterior abdominal wall were cauterized and lysed with the Ligasure device.    A additional 5 mm operative port was placed under direction of the laparoscope in the midline 4 cm below the umbilical incision.  The skin was injected with 0.25% Marcaine prior to placement of the port.   Right peritubal adhesions were lysed with the Harmonic scalpel.  The right ureter was identified.  The right infundibulopelvic ligament was cauterized and cut with the Ligasure instrument.  The right proximal fallopian tube and the right utero-ovarian ligament were cauterized and cut with the Ligasure instrument.  The right tube and ovary were placed in the cul de sac and later removed and sent to pathology.  Congenital adhesions of the sigmoid colon to the left pelvic side wall were lysed with the Harmonic scalpel.   The Harmonic scalpel was used to lyse adhesions between the left tube and the sigmoid colon.  The left ureter was identified and respected during the lysis of adhesions. The proximal left fallopian tube was cauterized and lysed with the Harmonic scalpel.  The mesosalpinx was similarly cauterized and cut.  The distal fallopian tube was separated from the pelvic sidewall and the colon with the Harmonic scalpel. The left tube removed from the peritoneal cavity and sent to pathology.   The Endocatch bag was placed in the peritoneal cavity and the right tube and ovary were placed in the bag and removed in pieces.  This specimen was sent to pathology.    The pneumoperitoneum was decreased, and hemostasis was good.     Surgicell surgicel powder was placed over the operative site and was rinsed with crystalloid solution and removed.   The right sided 11 mm trocar site was closed with the [employer] device and a 0/0 Vicryl suture in the fascia.     The pneumoperitoneum was then released,  and the the trocars were removed.   The right, left, and infraumbilical trocar sites were closed with subcuticular sutures of 4/0 Vicryl.  All incisions were closed with Dermabond.   The Hulka tenaculum was removed from the uterus, and the Foley catheter was removed.    The patient was awakened and extubated, and escorted to the recovery room in stable condition.  There were no complications.  All needle, instrument, and sponge counts were correct.  An MD assistant was necessary for tissue manipulation, management of instrumentation, retraction and positioning of the patient.  Approximately 30 minutes of additional dissection time was necessary due to pelvic adhesions.    Lenard Galloway, M.D.

## 2022-11-27 NOTE — Discharge Instructions (Addendum)
Hi Brianna [name],   I removed your IUD and I did the laparoscopic removal of your right ovary and both fallopian tubes.  The cyst was adjacent to the right ovary and tube. I also found and removed scar tissue around both tubes and ovaries.  Everything went well!  Josefa Half, MD         No acetaminophen/Tylenol until after 3:00 pm today if needed.  No ibuprofen, Advil, Aleve, Motrin, ketorolac, meloxicam, naproxen, or other NSAIDS until after 5:00 pm today if needed.   Post Anesthesia Home Care Instructions  Activity: Get plenty of rest for the remainder of the day. A responsible individual must stay with you for 24 hours following the procedure.  For the next 24 hours, DO NOT: -Drive a car -Paediatric nurse -Drink alcoholic beverages -Take any medication unless instructed by your physician -Make any legal decisions or sign important papers.  Meals: Start with liquid foods such as gelatin or soup. Progress to regular foods as tolerated. Avoid greasy, spicy, heavy foods. If nausea and/or vomiting occur, drink only clear liquids until the nausea and/or vomiting subsides. Call your physician if vomiting continues.  Special Instructions/Symptoms: Your throat may feel dry or sore from the anesthesia or the breathing tube placed in your throat during surgery. If this causes discomfort, gargle with warm salt water. The discomfort should disappear within 24 hours.  If you had a scopolamine patch placed behind your ear for the management of post- operative nausea and/or vomiting:  1. The medication in the patch is effective for 72 hours, after which it should be removed.  Wrap patch in a tissue and discard in the trash. Wash hands thoroughly with soap and water. 2. You may remove the patch earlier than 72 hours if you experience unpleasant side effects which may include dry mouth, dizziness or visual disturbances. 3. Avoid touching the patch. Wash your hands with soap and water after  contact with the patch.

## 2022-11-27 NOTE — Anesthesia Postprocedure Evaluation (Signed)
Anesthesia Post Note  Patient: Brianna [name]  Procedure(s) Performed: LAPAROSCOPIC BILATERAL SALPINGECTOMY, right oophorectomy with collection of pelvic washings (Abdomen) LAPAROSCOPIC LYSIS OF ADHESIONS (Abdomen)     Patient location during evaluation: PACU Anesthesia Type: General Level of consciousness: awake and alert Pain management: pain level controlled Vital Signs Assessment: post-procedure vital signs reviewed and stable Respiratory status: spontaneous breathing, nonlabored ventilation, respiratory function stable and patient connected to nasal cannula oxygen Cardiovascular status: blood pressure returned to baseline and stable Postop Assessment: no apparent nausea or vomiting Anesthetic complications: no   No notable events documented.  Last Vitals:  Vitals:   11/27/22 1245 11/27/22 1335  BP: (!) 84/51 (!) 93/54  Pulse: 63 (!) 56  Resp: 17 16  Temp:  36.6 C  SpO2: 91% 93%    Last Pain:  Vitals:   11/27/22 1335  TempSrc: Axillary  PainSc: 6                  Bita Cartwright P Lulabelle Desta

## 2022-11-27 NOTE — Anesthesia Procedure Notes (Signed)
Procedure Name: Intubation Date/Time: 11/27/2022 10:56 AM  Performed by: Suan Halter, CRNAPre-anesthesia Checklist: Patient identified, Emergency Drugs available, Suction available and Patient being monitored Patient Re-evaluated:Patient Re-evaluated prior to induction Oxygen Delivery Method: Circle system utilized Preoxygenation: Pre-oxygenation with 100% oxygen Induction Type: IV induction Ventilation: Mask ventilation without difficulty Laryngoscope Size: Mac and 3 Grade View: Grade I Tube type: Oral Tube size: 7.0 mm Number of attempts: 1 Airway Equipment and Method: Stylet and Oral airway Placement Confirmation: ETT inserted through vocal cords under direct vision, positive ETCO2 and breath sounds checked- equal and bilateral Secured at: 22 cm Tube secured with: Tape Dental Injury: Teeth and Oropharynx as per pre-operative assessment

## 2022-11-27 NOTE — H&P (Signed)
Office Visit  11/21/2022 [facility], Everardo All, MD Obstetrics and Gynecology Right ovarian cyst Dx   Additional Documentation  Vitals:  BP 118/80 (BP Location: Right Arm, Patient Position: Sitting, Cuff Size: Normal) Ht '5\' 6"'$  (1.676 m) Wt 80.3 kg LMP  (LMP Unknown) BMI 28.57 kg/m BSA 1.93 m  More Vitals  Flowsheets:  MEWS Score, NEWS, Anthropometrics   Encounter Info:  Billing Info, History, Allergies, Detailed Report    All Notes   Progress Notes by Nunzio Cobbs, MD at 11/21/2022 2:15 PM  Author: Nunzio Cobbs, MD Author Type: Physician Filed: 11/23/2022 10:58 AM  Note Status: Signed Cosign: Cosign Not Required Encounter Date: 11/21/2022  Editor: Nunzio Cobbs, MD (Physician)      Prior Versions: 1. Enrigue Catena, [place] (Scientist, research (life sciences)) at 11/21/2022  2:26 PM - Sign when Signing Visit   2. Derrill Memo, CMA (Certified Medical Assistant) at 11/13/2022  8:51 AM - Sign when Signing Visit  GYNECOLOGY  VISIT   HPI: 51 y.o.   Married  Caucasian  female   G2P2002 with No LMP recorded (lmp unknown). (Menstrual status: IUD).   here for   pre-op.  She has a right ovarian cyst increasing in size.    US done on 08/23/22 to check for IUD due to absent threads on pelvic exam showed: Uterus 8.81 x 5.96 x 4.68 cm.  No myometrial masses.  EMS 2.38 mm. IUD in proper position in uterine cavity.  String 1 cm from external os.  Left ovary 2.94 x 1.19 x 0.87 cm.  Right ovary 4.67 x 3.65 x 2.50 cm.   2 cysts:  3.8 x 3.4 x 2.1 cm, avascular with 2 septations.                2.1 x 1.2 cm with 4 mm solid component along internal wall.  No free fluid.    Labs on 08/23/22: CA125 11. FSH 13.1 Estradiol 293.   Her follow up ultrasound on 10/18/22 showed:  Uterus 8.42 x 6.11 x 4.78 mm   No myometrial masses.  EMS 5.23 mm.  IUD in endometrial canal.  Left ovary 3.11 x 1.72 x 0.79 cm.  Right  ovary 5.74 x 4.04 x 3.28 cm.  2 cysts:  3.8 x 3.4 x 2.8 cm, slightly larger.  Septation noted. 2.16 x 1.36 cm cyst with solid component shows no significant change in size or appearance. No free fluid.    No health changes since her last office visit.    GYNECOLOGIC HISTORY: No LMP recorded (lmp unknown). (Menstrual status: IUD). Contraception:  IUD, Mirena 01/17/17 Menopausal hormone therapy:  n/a Last mammogram:  10/23/22, Breast Density Category B, BI-RADS CATEGORY 1 Negative Last pap smear:   07/18/22 negative: negative HR HPV, 09-30-14 Neg:Neg HR HPV          OB History       Gravida  2   Para  2   Term  2   Preterm      AB      Living  2        SAB      IAB      Ectopic      Multiple      Live Births                        Patient Active Problem List  Diagnosis Date Noted   Genetic testing 10/17/2022   Hypothyroidism 09/30/2014          Past Medical History:  Diagnosis Date   Encounter for insertion of mirena IUD 11/09/2011   History of migraine headaches      no aura   Hyperlipidemia 09/30/14   Hypothyroidism 09/30/2014   Thyroid disease             Past Surgical History:  Procedure Laterality Date   CESAREAN SECTION   x2   NASAL SEPTUM SURGERY       NECK SURGERY       TONSILLECTOMY                Current Outpatient Medications  Medication Sig Dispense Refill   FLUoxetine (PROZAC) 10 MG capsule Take by mouth.       ibuprofen (ADVIL,MOTRIN) 200 MG tablet Take 400-800 mg by mouth daily as needed for headache or moderate pain.        LORazepam (ATIVAN) 0.5 MG tablet Take 0.5 mg by mouth daily as needed for anxiety.    0   pantoprazole (PROTONIX) 40 MG tablet Take 40 mg by mouth daily as needed (indigestion).        rosuvastatin (CRESTOR) 20 MG tablet Take 20 mg by mouth at bedtime.       SYNTHROID 112 MCG tablet Take by mouth.       triamcinolone cream (KENALOG) 0.1 % APPLY 1 APPLICATION EXTERNALLY TO THE AFFECTED AREA TWICE A DAY         No current facility-administered medications for this visit.      ALLERGIES: Patient has no known allergies.        Family History  Problem Relation Age of Onset   Heart attack Mother     Stroke Mother     Stomach cancer Father 60   Breast cancer Sister 39   Colon cancer Sister 48   Uterine cancer Sister          vs ovarian   Breast cancer Sister 13   Stomach cancer Paternal Aunt     Stomach cancer Paternal Grandmother 36      Social History         Socioeconomic History   Marital status: Married      Spouse name: Not on file   Number of children: Not on file   Years of education: Not on file   Highest education level: Not on file  Occupational History   Not on file  Tobacco Use   Smoking status: Never   Smokeless tobacco: Never  Vaping Use   Vaping Use: Never used  Substance and Sexual Activity   Alcohol use: No   Drug use: No   Sexual activity: Yes      Partners: Male      Birth control/protection: I.U.D.      Comment: Mirena IUD 01-24-17  Other Topics Concern   Not on file  Social History Narrative    Lives with spouse    Works in Press photographer    Caffeine: 2+ servings daily, soda    2 children - 1 boy, 1 girl    Secretary/administrator grad    Social Determinants of Health    Financial Resource Strain: Not on file  Food Insecurity: Not on file  Transportation Needs: Not on file  Physical Activity: Not on file  Stress: Not on file  Social Connections: Not on file  Intimate Partner Violence: Not on file  Review of Systems  All other systems reviewed and are negative.     PHYSICAL EXAMINATION:     BP 118/80 (BP Location: Right Arm, Patient Position: Sitting, Cuff Size: Normal)   Ht '5\' 6"'$  (1.676 m)   Wt 177 lb (80.3 kg)   LMP  (LMP Unknown)   BMI 28.57 kg/m     General appearance: alert, cooperative and appears stated age Head: Normocephalic, without obvious abnormality, atraumatic Neck: no adenopathy, supple, symmetrical, trachea midline and thyroid  normal to inspection and palpation Lungs: clear to auscultation bilaterally Heart: regular rate and rhythm Abdomen: soft, non-tender, no masses,  no organomegaly Extremities: extremities normal, atraumatic, no cyanosis or edema Skin: Skin color, texture, turgor normal. No rashes or lesions No abnormal inguinal nodes palpated Neurologic: Grossly normal   Pelvic: External genitalia:  no lesions              Urethra:  normal appearing urethra with no masses, tenderness or lesions              Bartholins and Skenes: normal                 Vagina: normal appearing vagina with normal color and discharge, no lesions              Cervix: no lesions.  IUDstrings not seen                Bimanual Exam:  Uterus:  normal size, contour, position, consistency, mobility, non-tender              Adnexa: fullness right, nontender.  no mass, fullness, tenderness on left.         Chaperone was present for exam:  Emilly   ASSESSMENT   Enlarging complex right ovary. Mirena IUD.  Proper position by pelvic US. FH breast and colon cancer.  Negative genetic testing.  19.7% lifetime risk of breast cancer.   PLAN   Proceed with laparoscopic right oophorectomy, bilateral salpingectomy, collection of pelvic washings.  Risks, benefits, and alternatives discussed with the patient who wishes to proceed.   An After Visit Summary was printed and given to the patient.

## 2022-11-28 ENCOUNTER — Encounter (HOSPITAL_BASED_OUTPATIENT_CLINIC_OR_DEPARTMENT_OTHER): Payer: Self-pay | Admitting: Obstetrics and Gynecology

## 2022-11-28 LAB — CYTOLOGY - NON PAP

## 2022-11-28 LAB — SURGICAL PATHOLOGY

## 2022-11-30 NOTE — Progress Notes (Unsigned)
GYNECOLOGY  VISIT   HPI: 51 y.o.   Married  Caucasian  female   G2P2002 with Patient's last menstrual period was 11/18/2022.   here for post op.    Status post laparoscopic right oophorectomy, bilateral salpingectomy, collection of pelvic washings, lysis of adhesions, removal of Mirena IUD.   Final pathology report showed:  right ovarian benign serous cystadenofibroma, normal tubes, benign pelvic washings.   Little post op pain.   GYNECOLOGIC HISTORY: Patient's last menstrual period was 11/18/2022. Contraception:  just removed Menopausal hormone therapy:  n/a Last mammogram:  10/23/22 Breast Density Category B, BI-RADS CATEGORY 1 negative Last pap smear:   07/18/22 negative: HR HPV negative, 09-30-14 Neg:Neg HR HPV           OB History     Gravida  2   Para  2   Term  2   Preterm      AB      Living  2      SAB      IAB      Ectopic      Multiple      Live Births                 Patient Active Problem List   Diagnosis Date Noted   Genetic testing 10/17/2022   Hypothyroidism 09/30/2014    Past Medical History:  Diagnosis Date   Anxiety    Follows w/ PCP @ [facility] in [place].   COVID-19 2022   Patient states that she took an anti-viral.   Encounter for insertion of mirena IUD 11/09/2011   Genetic testing 10/17/2022   GERD (gastroesophageal reflux disease)    Takes Omeprazole as needed, 11/26/22.   History of migraine headaches    no aura   Hyperlipidemia 09/30/2014   Follows with [place] @ [facility].   Hypothyroidism 09/30/2014   Follows with [facility] [place].   Ovarian cyst 2023   right ovary   Thyroid disease     Past Surgical History:  Procedure Laterality Date   CESAREAN SECTION  x2   LAPAROSCOPIC BILATERAL SALPINGECTOMY N/A 11/27/2022   Procedure: LAPAROSCOPIC BILATERAL SALPINGECTOMY, right oophorectomy with collection of pelvic washings;  Surgeon: Nunzio Cobbs, MD;  Location: [facility];  Service: Gynecology;  Laterality: N/A;   LAPAROSCOPIC LYSIS OF ADHESIONS N/A 11/27/2022   Procedure: LAPAROSCOPIC LYSIS OF ADHESIONS;  Surgeon: Nunzio Cobbs, MD;  Location: [facility];  Service: Gynecology;  Laterality: N/A;   NASAL SEPTUM SURGERY     about 20 years ago per pt on 11/26/22   NECK SURGERY  2018   TONSILLECTOMY     as a child    Current Outpatient Medications  Medication Sig Dispense Refill   FLUoxetine (PROZAC) 10 MG capsule Take by mouth.     ibuprofen (ADVIL) 800 MG tablet Take 1 tablet (800 mg total) by mouth daily as needed for headache or moderate pain. 30 tablet 1   LORazepam (ATIVAN) 0.5 MG tablet Take 0.5 mg by mouth daily as needed for anxiety.   0   Multiple Vitamin (MULTIVITAMIN WITH MINERALS) TABS tablet Take 1 tablet by mouth daily.     oxyCODONE-acetaminophen (PERCOCET) 5-325 MG tablet Take 1 tablet by mouth every 4 (four) hours as needed for severe pain. 20 tablet 0   pantoprazole (PROTONIX) 40 MG tablet Take 40 mg by mouth daily as needed (indigestion).  rosuvastatin (CRESTOR) 20 MG tablet Take 20 mg by mouth at bedtime.     SYNTHROID 112 MCG tablet Take by mouth.     No current facility-administered medications for this visit.     ALLERGIES: Patient has no known allergies.  Family History  Problem Relation Age of Onset   Heart attack Mother    Stroke Mother    Stomach cancer Father 21   Breast cancer Sister 48   Colon cancer Sister 82   Uterine cancer Sister        vs ovarian   Breast cancer Sister 68   Stomach cancer Paternal Aunt    Stomach cancer Paternal Grandmother 68    Social History   Socioeconomic History   Marital status: Married    Spouse name: Not on file   Number of children: Not on file   Years of education: Not on file   Highest education level: Not on file  Occupational History   Not on file  Tobacco Use   Smoking status: Never   Smokeless tobacco: Never  Vaping Use    Vaping Use: Never used  Substance and Sexual Activity   Alcohol use: No   Drug use: No   Sexual activity: Yes    Partners: Male    Birth control/protection: I.U.D.    Comment: Mirena IUD 01-24-17  Other Topics Concern   Not on file  Social History Narrative   Lives with spouse   Works in Press photographer   Caffeine: 2+ servings daily, soda   2 children - 1 boy, 1 girl   Secretary/administrator grad   Social Determinants of Health   Financial Resource Strain: Not on file  Food Insecurity: Not on file  Transportation Needs: Not on file  Physical Activity: Not on file  Stress: Not on file  Social Connections: Not on file  Intimate Partner Violence: Not on file    Review of Systems  All other systems reviewed and are negative.   PHYSICAL EXAMINATION:    BP 124/78 (BP Location: Left Arm, Patient Position: Sitting, Cuff Size: Normal)   Pulse 84   Ht '5\' 6"'$  (1.676 m)   Wt 168 lb (76.2 kg)   LMP 11/18/2022   SpO2 99%   BMI 27.12 kg/m     General appearance: alert, cooperative and appears stated age    Abdomen: incisions:  intact.  Abdomen is soft, non-tender, no masses,  no organomegaly  Pelvic: External genitalia:  no lesions              Urethra:  normal appearing urethra with no masses, tenderness or lesions              Bartholins and Skenes: normal                 Vagina: normal appearing vagina with normal color and discharge, no lesions              Cervix: no lesions                Bimanual Exam:  Uterus:  normal size, contour, position, consistency, mobility, non-tender              Adnexa: no mass, fullness, tenderness      Chaperone was present for exam:  Joy.  ASSESSMENT  Status post laparoscopic right oophorectomy, bilateral salpingectomy, collection of pelvic washings, lysis of adhesions, removal of Mirena IUD.  Doing well post op.   PLAN  Surgical findings, procedure, and pathology  report reviewed with patient.  She will monitor her menstrual periods. Questions  invited and answered. FU for annual exam and prn.   An After Visit Summary was printed and given to the patient.

## 2022-12-09 DIAGNOSIS — G4733 Obstructive sleep apnea (adult) (pediatric): Secondary | ICD-10-CM | POA: Diagnosis not present

## 2022-12-12 ENCOUNTER — Ambulatory Visit (INDEPENDENT_AMBULATORY_CARE_PROVIDER_SITE_OTHER): Payer: BC Managed Care – PPO | Admitting: Obstetrics and Gynecology

## 2022-12-12 ENCOUNTER — Encounter: Payer: Self-pay | Admitting: Obstetrics and Gynecology

## 2022-12-12 VITALS — BP 124/78 | HR 84 | Ht 66.0 in | Wt 168.0 lb

## 2022-12-12 DIAGNOSIS — Z9889 Other specified postprocedural states: Secondary | ICD-10-CM

## 2022-12-31 ENCOUNTER — Other Ambulatory Visit: Payer: Self-pay

## 2022-12-31 ENCOUNTER — Other Ambulatory Visit: Payer: Self-pay | Admitting: Obstetrics and Gynecology

## 2022-12-31 DIAGNOSIS — R52 Pain, unspecified: Secondary | ICD-10-CM

## 2022-12-31 MED ORDER — IBUPROFEN 800 MG PO TABS: 800.0000 mg | ORAL_TABLET | Freq: Every day | ORAL | 0 refills | Status: AC | PRN

## 2022-12-31 NOTE — Progress Notes (Signed)
Rx for Ibuprofen 800 mg every 8 hours prn.  Routine refills not anticipated.

## 2022-12-31 NOTE — Telephone Encounter (Signed)
S/p lap BTL w/ rt oophorectomy and collection of pelvic washings on 11/27/2022.  Rx sent for #30 with 1 refill to alternate pharmacy on 11/27/2022.   Will inquire to pt via mychart msg.

## 2023-01-09 DIAGNOSIS — G4733 Obstructive sleep apnea (adult) (pediatric): Secondary | ICD-10-CM | POA: Diagnosis not present

## 2023-01-10 DIAGNOSIS — G4733 Obstructive sleep apnea (adult) (pediatric): Secondary | ICD-10-CM | POA: Diagnosis not present

## 2023-01-14 ENCOUNTER — Encounter: Payer: BC Managed Care – PPO | Admitting: Hematology and Oncology

## 2023-01-22 ENCOUNTER — Other Ambulatory Visit: Payer: Self-pay | Admitting: Obstetrics and Gynecology

## 2023-02-07 DIAGNOSIS — G4733 Obstructive sleep apnea (adult) (pediatric): Secondary | ICD-10-CM | POA: Diagnosis not present

## 2023-02-13 DIAGNOSIS — R5383 Other fatigue: Secondary | ICD-10-CM | POA: Diagnosis not present

## 2023-02-13 DIAGNOSIS — R001 Bradycardia, unspecified: Secondary | ICD-10-CM | POA: Diagnosis not present

## 2023-02-13 DIAGNOSIS — R9431 Abnormal electrocardiogram [ECG] [EKG]: Secondary | ICD-10-CM | POA: Diagnosis not present

## 2023-03-05 DIAGNOSIS — I493 Ventricular premature depolarization: Secondary | ICD-10-CM | POA: Diagnosis not present

## 2023-03-10 DIAGNOSIS — G4733 Obstructive sleep apnea (adult) (pediatric): Secondary | ICD-10-CM | POA: Diagnosis not present

## 2023-03-15 DIAGNOSIS — I493 Ventricular premature depolarization: Secondary | ICD-10-CM | POA: Diagnosis not present

## 2023-03-19 DIAGNOSIS — R001 Bradycardia, unspecified: Secondary | ICD-10-CM | POA: Diagnosis not present

## 2023-03-19 DIAGNOSIS — Z1330 Encounter for screening examination for mental health and behavioral disorders, unspecified: Secondary | ICD-10-CM | POA: Diagnosis not present

## 2023-03-19 DIAGNOSIS — G4733 Obstructive sleep apnea (adult) (pediatric): Secondary | ICD-10-CM | POA: Diagnosis not present

## 2023-03-19 DIAGNOSIS — R9431 Abnormal electrocardiogram [ECG] [EKG]: Secondary | ICD-10-CM | POA: Diagnosis not present

## 2023-03-20 DIAGNOSIS — M7712 Lateral epicondylitis, left elbow: Secondary | ICD-10-CM | POA: Diagnosis not present

## 2023-03-20 DIAGNOSIS — E039 Hypothyroidism, unspecified: Secondary | ICD-10-CM | POA: Diagnosis not present

## 2023-03-20 DIAGNOSIS — E785 Hyperlipidemia, unspecified: Secondary | ICD-10-CM | POA: Diagnosis not present

## 2023-04-01 IMAGING — MG MM DIGITAL SCREENING BILAT W/ TOMO AND CAD
6 of 12 series · 6 of 36 positions shown · non-contrast
Comparison: Previous exam(s).

CLINICAL DATA: Screening.

EXAM:
DIGITAL SCREENING BILATERAL MAMMOGRAM WITH TOMOSYNTHESIS AND CAD
TECHNIQUE: Bilateral screening digital craniocaudal and mediolateral oblique
mammograms were obtained. Bilateral screening digital breast
tomosynthesis was performed. The images were evaluated with
computer-aided detection.

[R CC synth-2D]
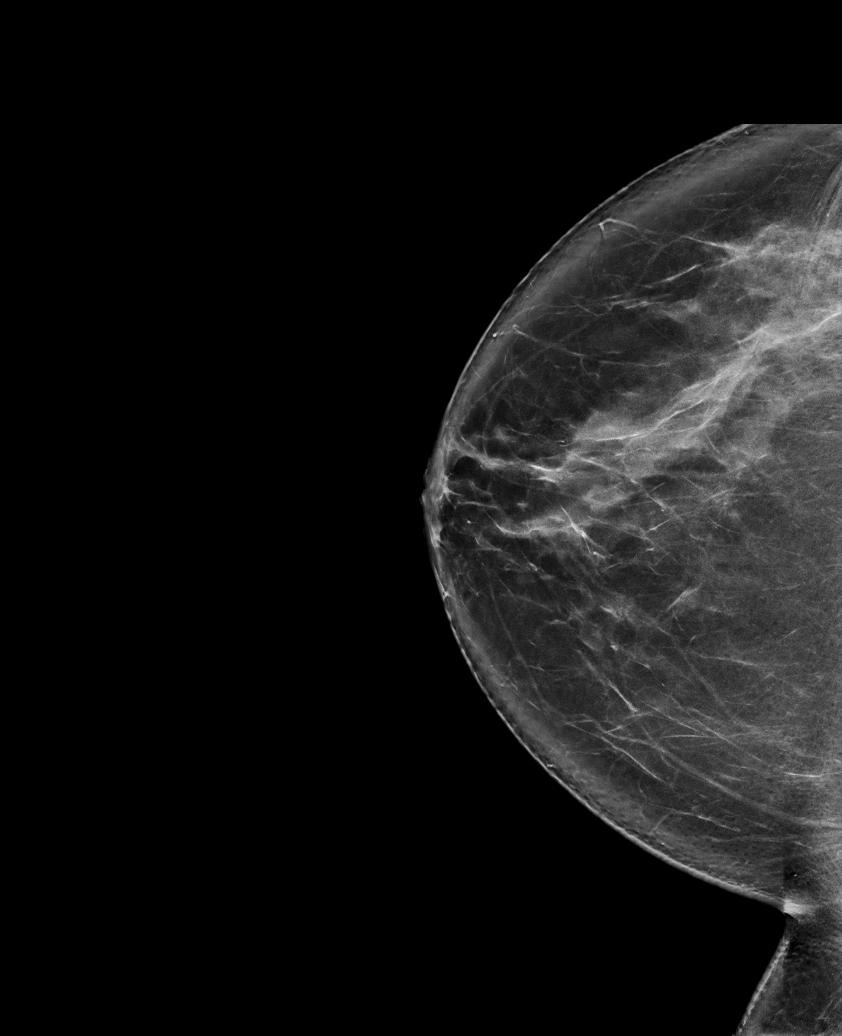

[L CC synth-2D]
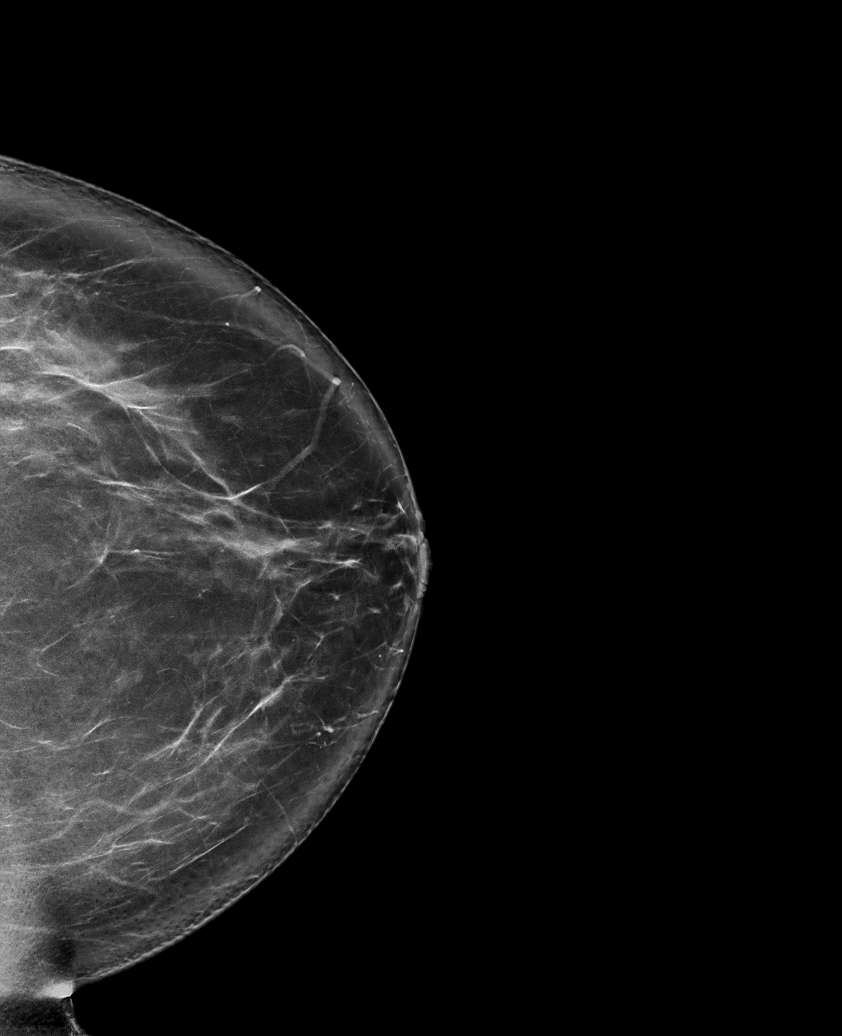

[L MLO synth-2D (1 of 2)]
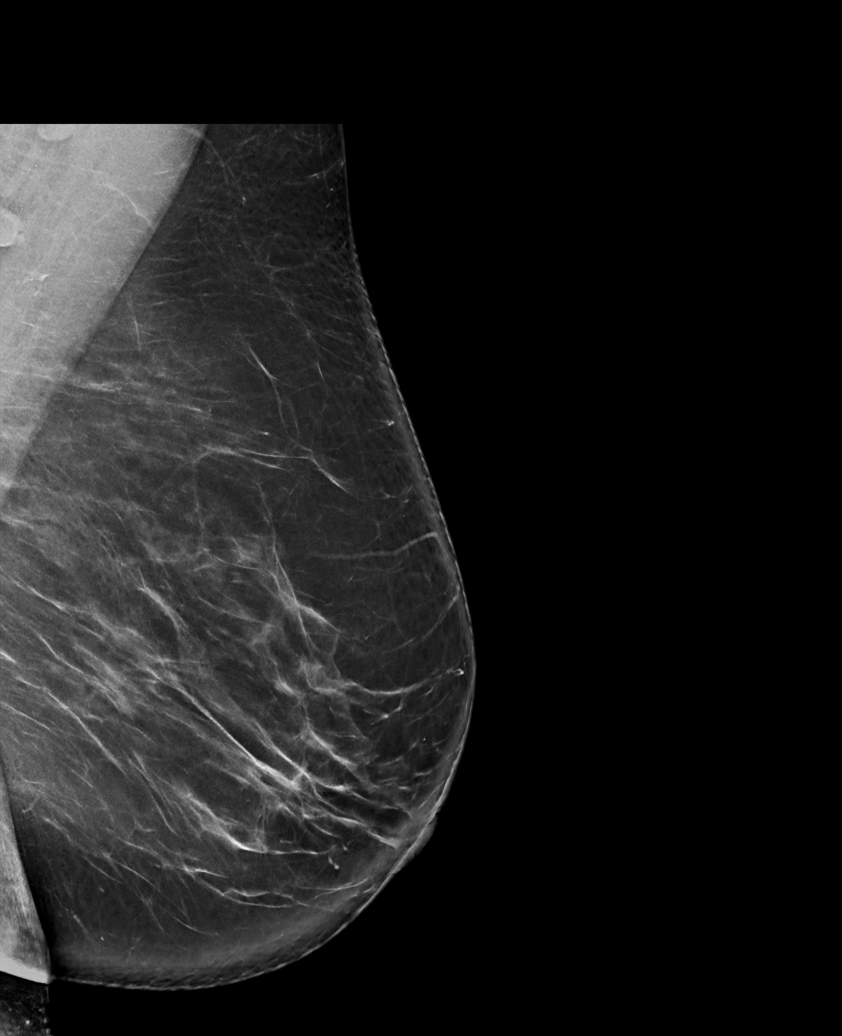

[R MLO synth-2D (1 of 2)]
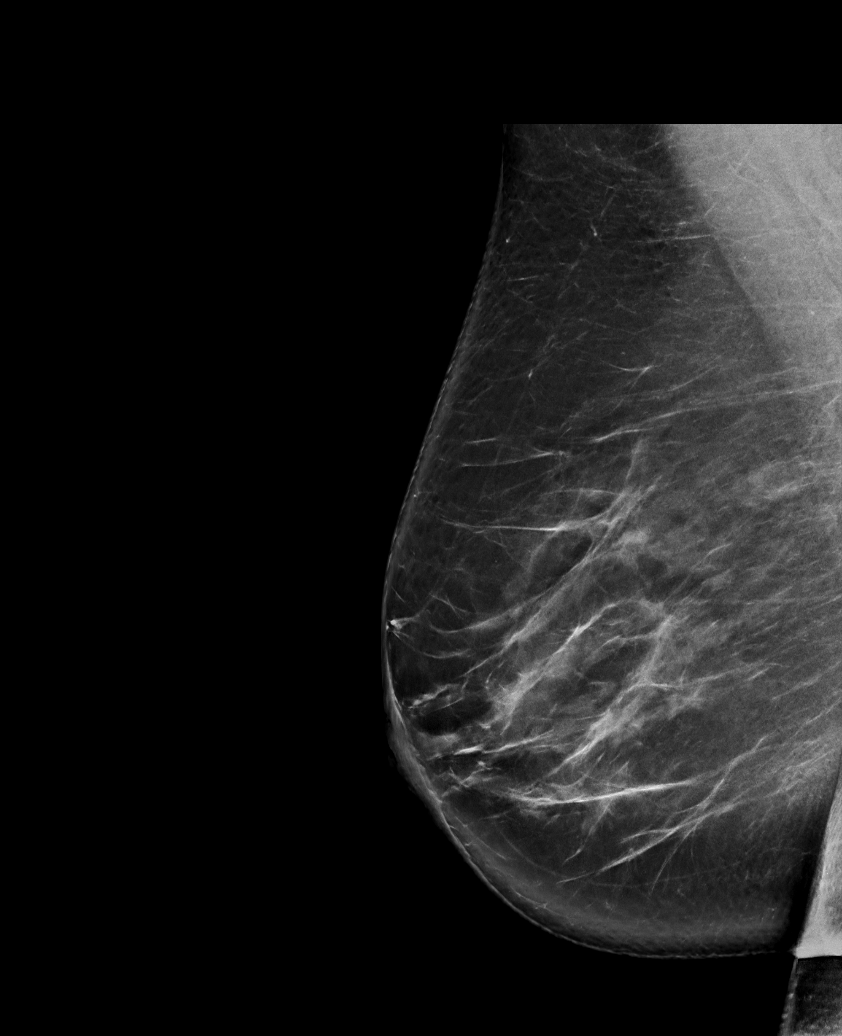

[R MLO synth-2D (2 of 2)]
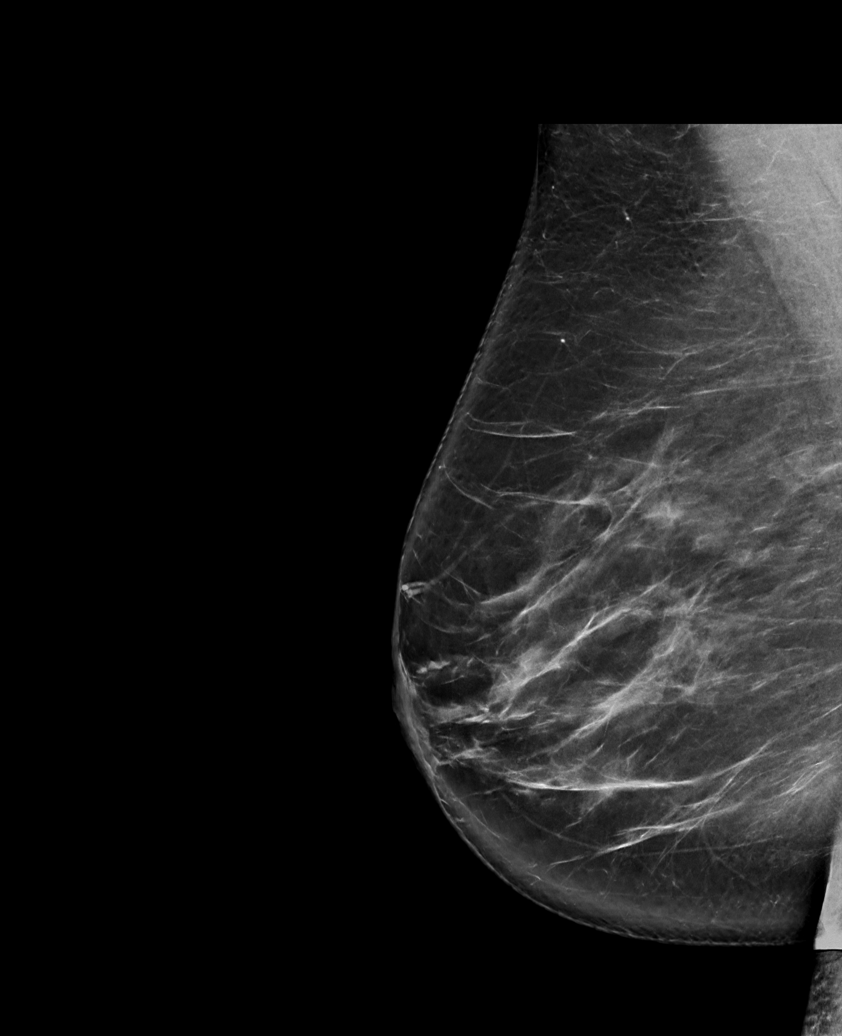

[L MLO synth-2D (2 of 2)]
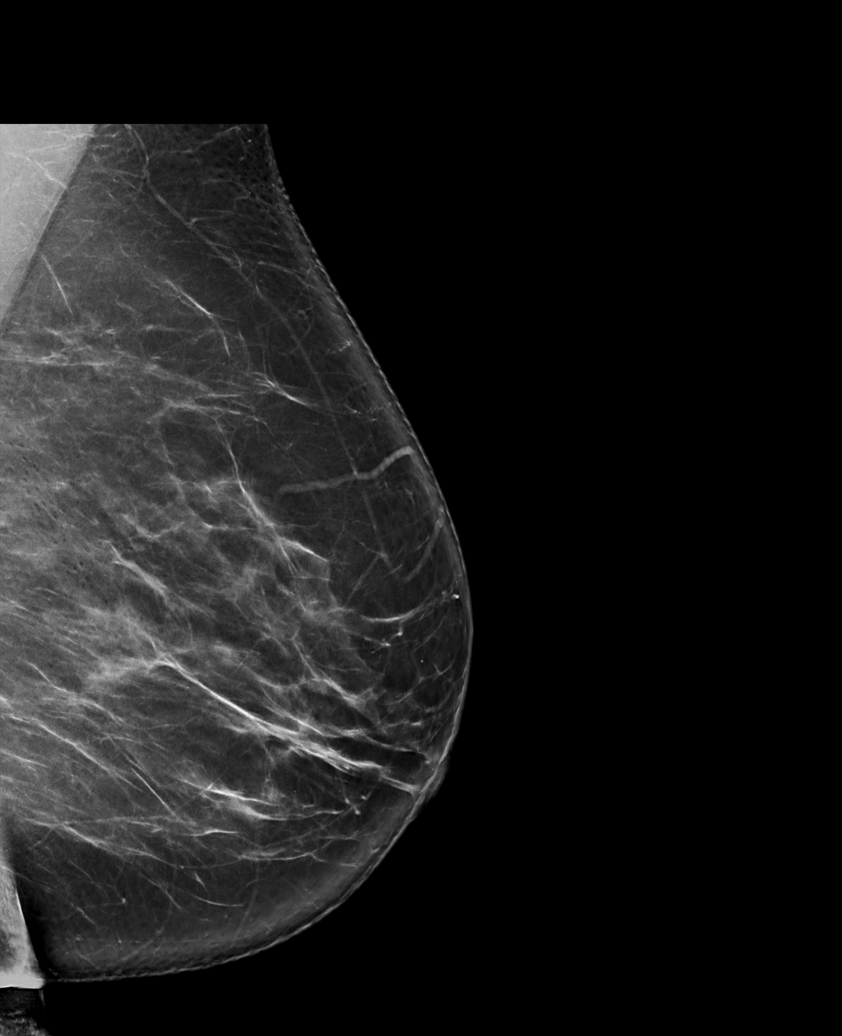

[6 of 36 positions shown; findings below may reference images not displayed]

ACR Breast Density Category b: There are scattered areas of
fibroglandular density.
FINDINGS: There are no findings suspicious for malignancy.
IMPRESSION: No mammographic evidence of malignancy. A result letter of this
screening mammogram will be mailed directly to the patient.

RECOMMENDATION:
Screening mammogram in one year. (Code:51-O-LD2)

BI-RADS CATEGORY  1: Negative.

## 2023-04-09 DIAGNOSIS — G4733 Obstructive sleep apnea (adult) (pediatric): Secondary | ICD-10-CM | POA: Diagnosis not present

## 2023-04-11 DIAGNOSIS — G4733 Obstructive sleep apnea (adult) (pediatric): Secondary | ICD-10-CM | POA: Diagnosis not present

## 2023-05-17 DIAGNOSIS — E039 Hypothyroidism, unspecified: Secondary | ICD-10-CM | POA: Diagnosis not present

## 2023-07-12 DIAGNOSIS — G4733 Obstructive sleep apnea (adult) (pediatric): Secondary | ICD-10-CM | POA: Diagnosis not present

## 2023-09-18 DIAGNOSIS — F411 Generalized anxiety disorder: Secondary | ICD-10-CM | POA: Diagnosis not present

## 2023-09-18 DIAGNOSIS — R41840 Attention and concentration deficit: Secondary | ICD-10-CM | POA: Diagnosis not present

## 2023-09-18 DIAGNOSIS — E039 Hypothyroidism, unspecified: Secondary | ICD-10-CM | POA: Diagnosis not present

## 2023-09-18 DIAGNOSIS — Z8619 Personal history of other infectious and parasitic diseases: Secondary | ICD-10-CM | POA: Diagnosis not present

## 2023-11-23 DIAGNOSIS — R1030 Lower abdominal pain, unspecified: Secondary | ICD-10-CM | POA: Diagnosis not present

## 2023-11-23 DIAGNOSIS — R3129 Other microscopic hematuria: Secondary | ICD-10-CM | POA: Diagnosis not present

## 2023-11-23 DIAGNOSIS — R109 Unspecified abdominal pain: Secondary | ICD-10-CM | POA: Diagnosis not present

## 2023-11-26 DIAGNOSIS — N281 Cyst of kidney, acquired: Secondary | ICD-10-CM | POA: Diagnosis not present

## 2023-11-26 DIAGNOSIS — N200 Calculus of kidney: Secondary | ICD-10-CM | POA: Diagnosis not present

## 2023-12-18 DIAGNOSIS — Z8619 Personal history of other infectious and parasitic diseases: Secondary | ICD-10-CM | POA: Diagnosis not present

## 2023-12-18 DIAGNOSIS — F900 Attention-deficit hyperactivity disorder, predominantly inattentive type: Secondary | ICD-10-CM | POA: Diagnosis not present

## 2023-12-25 ENCOUNTER — Encounter: Payer: Self-pay | Admitting: Family Medicine

## 2023-12-25 ENCOUNTER — Ambulatory Visit: Payer: BC Managed Care – PPO | Admitting: Family Medicine

## 2023-12-25 VITALS — BP 124/80 | HR 83 | Temp 98.4°F | Ht 66.0 in | Wt 182.0 lb

## 2023-12-25 DIAGNOSIS — R41840 Attention and concentration deficit: Secondary | ICD-10-CM

## 2023-12-25 DIAGNOSIS — R5383 Other fatigue: Secondary | ICD-10-CM | POA: Diagnosis not present

## 2023-12-25 DIAGNOSIS — E039 Hypothyroidism, unspecified: Secondary | ICD-10-CM | POA: Diagnosis not present

## 2023-12-25 DIAGNOSIS — F419 Anxiety disorder, unspecified: Secondary | ICD-10-CM

## 2023-12-25 DIAGNOSIS — F32A Depression, unspecified: Secondary | ICD-10-CM | POA: Diagnosis not present

## 2023-12-25 DIAGNOSIS — Z1379 Encounter for other screening for genetic and chromosomal anomalies: Secondary | ICD-10-CM | POA: Diagnosis not present

## 2023-12-25 DIAGNOSIS — Z6829 Body mass index (BMI) 29.0-29.9, adult: Secondary | ICD-10-CM

## 2023-12-25 MED ORDER — LORAZEPAM 0.5 MG PO TABS: 0.5000 mg | ORAL_TABLET | Freq: Three times a day (TID) | ORAL | 0 refills | Status: AC | PRN

## 2023-12-25 MED ORDER — ESCITALOPRAM OXALATE 10 MG PO TABS: 10.0000 mg | ORAL_TABLET | Freq: Every day | ORAL | 1 refills | Status: DC

## 2023-12-25 MED ORDER — WEGOVY 0.25 MG/0.5ML ~~LOC~~ SOAJ: 0.2500 mg | SUBCUTANEOUS | 1 refills | Status: DC

## 2023-12-25 NOTE — Progress Notes (Signed)
 Patient Office Visit  Assessment & Plan:  Other fatigue -     CBC with Differential/Platelet -     Comprehensive metabolic panel -     VITAMIN D  25 Hydroxy (Vit-D Deficiency, Fractures) -     Vitamin B12  Hypothyroidism, unspecified type -     TSH  Depression, unspecified depression type -     Ambulatory referral to Psychiatry -     Escitalopram  Oxalate; Take 1 tablet (10 mg total) by mouth daily.  Dispense: 90 tablet; Refill: 1 -     LORazepam ; Take 1 tablet (0.5 mg total) by mouth every 8 (eight) hours as needed for anxiety.  Dispense: 30 tablet; Refill: 0  Genetic testing  Attention deficit  Anxiety -     Ambulatory referral to Psychiatry -     LORazepam ; Take 1 tablet (0.5 mg total) by mouth every 8 (eight) hours as needed for anxiety.  Dispense: 30 tablet; Refill: 0  BMI 29.0-29.9,adult -     Wegovy ; Inject 0.25 mg into the skin once a week.  Dispense: 3 mL; Refill: 1   Psychiatry consult ordered due to worsening anxiety/depression, possible Mood disorder versus other.  We will try Lexapro  10 mg once a day.  Ativan  as needed.  Patient declines sleep aid.  Recommend healthy diet and consistent exercise.  Follow-up on lab work notify patient and adjust medications as necessary. Return in about 4 weeks (around 01/22/2024), or if symptoms worsen or fail to improve, for depression.   Subjective:    Patient ID: Brianna [name], female    DOB: [birth year]  Age: 53 y.o. MRN: [id number]  Chief Complaint  Patient presents with   Establish Care    HPI Patient was previously going to [facility] in [facility] but would like to establish with our practice. #1 hypothyroidism-patient has been on thyroid  medication for several years.  The dosage was changed a few months ago.  Patient has not had it rechecked.  Patient is feeling very fatigued and having difficulty losing weight. #2 fatigue-patient had extreme fatigue the last couple months does not sleep well.  Patient has 2  older sons one is in [place] and the other lives at home however patient does not want to take anything to help her sleep afraid that she will not hear her children..  Patient believes this is due to her anxiety #3 depression/anxiety-patient was on Prozac prescribed by [facility] physicians but stopped it.  Patient did not think that the Prozac helped her.  Patient denies any depression at this time but PHQ 9 positive today. Pt not suicidal but very fatigued, having insomnia and no motivation. Patient continues with Wellbutrin .  Patient was taking Ativan  as needed but does not have a refill of this.  Patient did think that Ativan  helped. #4 Adult attention deficit disorder.  Patient states she was diagnosed with this in [place].  Patient does do psychotherapy via telemedicine.  Patient has never been evaluated for this in [place] Patient did previously take Adderall but had negative side effects.  Patient has not tried anything else.  Patient has never been diagnosed with bipolar disorder.  Patient does have a niece who has been diagnosed with bipolar but mom and dad do not have it.  Patient thinks that she could get something for her ADD she would feel better. #5 ovarian cyst-patient did have an oophorectomy performed by Dr. Rexann Catalan in [place].  She did not have cancer. #6 elevated BMI-patient was previously  on Wegovy  last year and it did work for her but patient has regained her weight.  Patient is eating healthy and stays active.  Patient is frustrated with the weight gain. Health maintenance-patient declines flu shot, has up-to-date mammogram, and up-to-date colonoscopy.  Last colonoscopy was in 2023. The ASCVD Risk score (Arnett DK, et al., 2019) failed to calculate for the following reasons:   Cannot find a previous HDL lab   Cannot find a previous total cholesterol lab    ROS    Objective:    BP 124/80 (BP Location: Left Arm)   Pulse 83   Temp 98.4 F (36.9 C)   Ht 5\' 6"  (1.676 m)   Wt  182 lb (82.6 kg)   SpO2 98%   BMI 29.38 kg/m  BP Readings from Last 3 Encounters:  12/25/23 124/80  12/12/22 124/78  11/27/22 (!) 93/54   Wt Readings from Last 3 Encounters:  12/25/23 182 lb (82.6 kg)  12/12/22 168 lb (76.2 kg)  11/27/22 164 lb 14.4 oz (74.8 kg)    Physical Exam Vitals and nursing note reviewed.  Constitutional:      General: She is not in acute distress.    Appearance: Normal appearance.  HENT:     Head: Normocephalic.     Right Ear: Tympanic membrane, ear canal and external ear normal.     Left Ear: Tympanic membrane, ear canal and external ear normal.  Eyes:     Extraocular Movements: Extraocular movements intact.     Conjunctiva/sclera: Conjunctivae normal.     Pupils: Pupils are equal, round, and reactive to light.  Cardiovascular:     Rate and Rhythm: Normal rate and regular rhythm.     Heart sounds: Normal heart sounds.  Pulmonary:     Effort: Pulmonary effort is normal.     Breath sounds: Normal breath sounds. No wheezing.  Musculoskeletal:     Right lower leg: No edema.     Left lower leg: No edema.  Skin:    General: Skin is warm.  Neurological:     General: No focal deficit present.     Mental Status: She is alert and oriented to person, place, and time.  Psychiatric:        Attention and Perception: Attention normal.        Mood and Affect: Mood and affect normal.        Speech: Speech normal.        Behavior: Behavior normal. Behavior is cooperative.        Thought Content: Thought content normal.        Cognition and Memory: Cognition normal.        Judgment: Judgment normal.      No results found for any visits on 12/25/23.

## 2023-12-25 NOTE — Assessment & Plan Note (Signed)
 Discussion of Adult ADD

## 2023-12-26 LAB — COMPREHENSIVE METABOLIC PANEL
AG Ratio: 1.5 (calc) (ref 1.0–2.5)
ALT: 21 U/L (ref 6–29)
AST: 18 U/L (ref 10–35)
Albumin: 4.3 g/dL (ref 3.6–5.1)
Alkaline phosphatase (APISO): 44 U/L (ref 37–153)
BUN: 20 mg/dL (ref 7–25)
CO2: 25 mmol/L (ref 20–32)
Calcium: 9.2 mg/dL (ref 8.6–10.4)
Chloride: 104 mmol/L (ref 98–110)
Creat: 0.94 mg/dL (ref 0.50–1.03)
Globulin: 2.9 g/dL (ref 1.9–3.7)
Glucose, Bld: 80 mg/dL (ref 65–99)
Potassium: 3.9 mmol/L (ref 3.5–5.3)
Sodium: 139 mmol/L (ref 135–146)
Total Bilirubin: 0.5 mg/dL (ref 0.2–1.2)
Total Protein: 7.2 g/dL (ref 6.1–8.1)

## 2023-12-26 LAB — CBC WITH DIFFERENTIAL/PLATELET
Absolute Lymphocytes: 2135 {cells}/uL (ref 850–3900)
Absolute Monocytes: 470 {cells}/uL (ref 200–950)
Basophils Absolute: 79 {cells}/uL (ref 0–200)
Basophils Relative: 1.3 %
Eosinophils Absolute: 140 {cells}/uL (ref 15–500)
Eosinophils Relative: 2.3 %
HCT: 41.3 % (ref 35.0–45.0)
Hemoglobin: 13.9 g/dL (ref 11.7–15.5)
MCH: 29.9 pg (ref 27.0–33.0)
MCHC: 33.7 g/dL (ref 32.0–36.0)
MCV: 88.8 fL (ref 80.0–100.0)
MPV: 10.3 fL (ref 7.5–12.5)
Monocytes Relative: 7.7 %
Neutro Abs: 3276 {cells}/uL (ref 1500–7800)
Neutrophils Relative %: 53.7 %
Platelets: 256 10*3/uL (ref 140–400)
RBC: 4.65 10*6/uL (ref 3.80–5.10)
RDW: 13.7 % (ref 11.0–15.0)
Total Lymphocyte: 35 %
WBC: 6.1 10*3/uL (ref 3.8–10.8)

## 2023-12-26 LAB — VITAMIN B12: Vitamin B-12: 282 pg/mL (ref 200–1100)

## 2023-12-26 LAB — TSH: TSH: 3.73 m[IU]/L

## 2023-12-26 LAB — VITAMIN D 25 HYDROXY (VIT D DEFICIENCY, FRACTURES): Vit D, 25-Hydroxy: 22 ng/mL — ABNORMAL LOW (ref 30–100)

## 2023-12-27 ENCOUNTER — Encounter: Payer: Self-pay | Admitting: Family Medicine

## 2024-01-22 ENCOUNTER — Ambulatory Visit: Payer: BC Managed Care – PPO | Admitting: Family Medicine

## 2024-02-06 DIAGNOSIS — R1010 Upper abdominal pain, unspecified: Secondary | ICD-10-CM | POA: Diagnosis not present

## 2024-02-21 ENCOUNTER — Ambulatory Visit: Admitting: Family Medicine

## 2024-02-25 ENCOUNTER — Encounter: Payer: Self-pay | Admitting: Family Medicine

## 2024-02-25 ENCOUNTER — Ambulatory Visit (HOSPITAL_BASED_OUTPATIENT_CLINIC_OR_DEPARTMENT_OTHER)

## 2024-02-25 ENCOUNTER — Ambulatory Visit: Admitting: Family Medicine

## 2024-02-25 VITALS — BP 120/80 | HR 100 | Temp 98.4°F | Ht 66.0 in | Wt 176.5 lb

## 2024-02-25 DIAGNOSIS — N200 Calculus of kidney: Secondary | ICD-10-CM | POA: Diagnosis not present

## 2024-02-25 DIAGNOSIS — R1031 Right lower quadrant pain: Secondary | ICD-10-CM | POA: Diagnosis not present

## 2024-02-25 DIAGNOSIS — R300 Dysuria: Secondary | ICD-10-CM

## 2024-02-25 LAB — URINALYSIS, ROUTINE W REFLEX MICROSCOPIC
Glucose, UA: NEGATIVE
Hgb urine dipstick: NEGATIVE
Ketones, ur: NEGATIVE
Leukocytes,Ua: NEGATIVE
Nitrite: NEGATIVE
Protein, ur: NEGATIVE
Specific Gravity, Urine: 1.027 (ref 1.001–1.035)
pH: 5.5 (ref 5.0–8.0)

## 2024-02-25 NOTE — Progress Notes (Signed)
 Patient Office Visit  Assessment & Plan:  Right lower quadrant abdominal pain -     Urinalysis, Routine w reflex microscopic -     CT ABDOMEN PELVIS W CONTRAST; Future -     COMPLETE METABOLIC PANEL WITH GFR -     CBC with Differential/Platelet -     Urine Culture  Dysuria -     Urine Culture  Right nephrolithiasis -     CT ABDOMEN PELVIS W CONTRAST; Future -     COMPLETE METABOLIC PANEL WITH GFR -     CBC with Differential/Platelet   CT scan abd/pelvis to r/o stone. Pt will go to [facility] today.  Patient may need to go to urology for further evaluation.  Follow-up on lab work and notify patient.  Increase water intake and stop drinking the Pepsi's.  If the urine culture is positive we will treat. Will follow up on urine culture result and notify patient.  Return if symptoms worsen or fail to improve.   Subjective:    Patient ID: Brianna [name], female    DOB: 04-07-1971  Age: 53 y.o. MRN: [id number]  Chief Complaint  Patient presents with   Abdominal Pain    RLQ pain radiating into the R lower back. Dx with UTI in February.     HPI Pt having ongoing right flank pain and now RLQ abdominal pain for the past 2-3 days. Pt states the pain never completely went away since Feb.  Right sided flank pain started about one mos ago, went to the Urgent Care for presumed kidney stone. Pt has had kidney stones in the past and felt the same way. Last time she had kidney stone was last December. Pt was given Rx for flomax, oxycodone and Zofran. Pt's pain slightly improved but was told next day she had UTI and was Rx Macrobid. No gross hematuria. No fever or chills or vomiting. No previous hx of pyelo nephritis. Pt does not have hx of recurrent UTIs. Now having right lower abdominal pain with nausea. Pain level 6-7/10.  Pt has difficulty sleeping due to pain and nocturia. Patient took antibiotic and Finished macrobid March 5th. Pt concerned her symptoms have not improved and  are getting slightly worse.  Patient not having gross hematuria but the urine is dark.  Patient drinks 1 to 2 cups of water per day and drinks Pepsi.  The ASCVD Risk score (Arnett DK, et al., 2019) failed to calculate for the following reasons:   Cannot find a previous HDL lab   Cannot find a previous total cholesterol lab  Past Medical History:  Diagnosis Date   Anxiety    Follows w/ PCP @ [facility] in [place].   COVID-19 2022   Patient states that she took an anti-viral.   Encounter for insertion of Mirena IUD 11/09/2011   Genetic testing 10/17/2022   GERD (gastroesophageal reflux disease)    Takes Omeprazole as needed, 11/26/22.   History of migraine headaches    no aura   Hyperlipidemia 09/30/2014   Follows with [facility].   Hypothyroidism 09/30/2014   Follows with [facility] [place].   Ovarian cyst 2023   right ovary   Thyroid disease    Past Surgical History:  Procedure Laterality Date   CERVICAL DISCECTOMY     nov 2017 at c6/7   CESAREAN SECTION  x2   2000 and 2002   LAPAROSCOPIC BILATERAL SALPINGECTOMY N/A 11/27/2022  Procedure: LAPAROSCOPIC BILATERAL SALPINGECTOMY, right oophorectomy with collection of pelvic washings;  Surgeon: Patton Salles, MD;  Location: [facility];  Service: Gynecology;  Laterality: N/A;   LAPAROSCOPIC LYSIS OF ADHESIONS N/A 11/27/2022   Procedure: LAPAROSCOPIC LYSIS OF ADHESIONS;  Surgeon: Patton Salles, MD;  Location[facility];  Service: Gynecology;  Laterality: N/A;   LIPOSUCTION     2004   NASAL SEPTUM SURGERY     2001   NECK SURGERY  2018   RADIUS OSTEOTOMY     july 1990   TONSILLECTOMY     as a child   Social History   Tobacco Use   Smoking status: Never   Smokeless tobacco: Never  Vaping Use   Vaping status: Never Used  Substance Use Topics   Alcohol use: No   Drug use: No   Family History  Problem Relation Age of Onset    Heart attack Mother    Stroke Mother    Stomach cancer Father 72   Breast cancer Sister 105   Colon cancer Sister 52   Uterine cancer Sister        vs ovarian   Breast cancer Sister 57   Stomach cancer Paternal Aunt    Stomach cancer Paternal Grandmother 29   No Known Allergies  ROS    Objective:    BP 120/80   Pulse 100   Temp 98.4 F (36.9 C)   Ht 5\' 6"  (1.676 m)   Wt 176 lb 8 oz (80.1 kg)   SpO2 99%   BMI 28.49 kg/m  BP Readings from Last 3 Encounters:  02/25/24 120/80  12/25/23 124/80  12/12/22 124/78   Wt Readings from Last 3 Encounters:  02/25/24 176 lb 8 oz (80.1 kg)  12/25/23 182 lb (82.6 kg)  12/12/22 168 lb (76.2 kg)    Physical Exam Vitals and nursing note reviewed.  Constitutional:      Appearance: Normal appearance.  HENT:     Head: Normocephalic.     Right Ear: Tympanic membrane, ear canal and external ear normal.     Left Ear: Tympanic membrane, ear canal and external ear normal.  Eyes:     Extraocular Movements: Extraocular movements intact.     Pupils: Pupils are equal, round, and reactive to light.  Cardiovascular:     Rate and Rhythm: Normal rate and regular rhythm.     Heart sounds: Normal heart sounds.  Pulmonary:     Effort: Pulmonary effort is normal.     Breath sounds: Normal breath sounds.  Abdominal:     General: Bowel sounds are normal.     Tenderness: There is abdominal tenderness in the right lower quadrant. There is right CVA tenderness. There is no left CVA tenderness.  Musculoskeletal:     Right lower leg: No edema.     Left lower leg: No edema.     Comments: Patient having right sided flank pain, NO rash seen   Neurological:     General: No focal deficit present.     Mental Status: She is alert and oriented to person, place, and time.  Psychiatric:        Mood and Affect: Mood normal.        Behavior: Behavior normal.      Results for orders placed or performed in visit on 02/25/24  Urinalysis, Routine w reflex  microscopic  Result Value Ref Range   Color, Urine DARK YELLOW YELLOW  APPearance SLIGHTLY CLOUDY (A) CLEAR   Specific Gravity, Urine 1.027 1.001 - 1.035   pH 5.5 5.0 - 8.0   Glucose, UA NEGATIVE NEGATIVE   Bilirubin Urine 1+ (A) NEGATIVE   Ketones, ur NEGATIVE NEGATIVE   Hgb urine dipstick NEGATIVE NEGATIVE   Protein, ur NEGATIVE NEGATIVE   Nitrite NEGATIVE NEGATIVE   Leukocytes,Ua NEGATIVE NEGATIVE

## 2024-02-26 ENCOUNTER — Encounter: Payer: Self-pay | Admitting: Family Medicine

## 2024-02-26 ENCOUNTER — Encounter (HOSPITAL_BASED_OUTPATIENT_CLINIC_OR_DEPARTMENT_OTHER): Payer: Self-pay

## 2024-02-26 ENCOUNTER — Ambulatory Visit (HOSPITAL_BASED_OUTPATIENT_CLINIC_OR_DEPARTMENT_OTHER): Admission: RE | Admit: 2024-02-26 | Source: Ambulatory Visit

## 2024-02-26 LAB — URINE CULTURE
MICRO NUMBER:: 16215439
Result:: NO GROWTH
SPECIMEN QUALITY:: ADEQUATE

## 2024-02-26 LAB — CBC WITH DIFFERENTIAL/PLATELET
Absolute Lymphocytes: 1593 {cells}/uL (ref 850–3900)
Absolute Monocytes: 373 {cells}/uL (ref 200–950)
Basophils Absolute: 59 {cells}/uL (ref 0–200)
Basophils Relative: 1.1 %
Eosinophils Absolute: 70 {cells}/uL (ref 15–500)
Eosinophils Relative: 1.3 %
HCT: 43.1 % (ref 35.0–45.0)
Hemoglobin: 14.3 g/dL (ref 11.7–15.5)
MCH: 29.3 pg (ref 27.0–33.0)
MCHC: 33.2 g/dL (ref 32.0–36.0)
MCV: 88.3 fL (ref 80.0–100.0)
MPV: 10.5 fL (ref 7.5–12.5)
Monocytes Relative: 6.9 %
Neutro Abs: 3305 {cells}/uL (ref 1500–7800)
Neutrophils Relative %: 61.2 %
Platelets: 280 10*3/uL (ref 140–400)
RBC: 4.88 10*6/uL (ref 3.80–5.10)
RDW: 12.7 % (ref 11.0–15.0)
Total Lymphocyte: 29.5 %
WBC: 5.4 10*3/uL (ref 3.8–10.8)

## 2024-02-26 LAB — COMPLETE METABOLIC PANEL WITH GFR
AG Ratio: 1.4 (calc) (ref 1.0–2.5)
ALT: 27 U/L (ref 6–29)
AST: 24 U/L (ref 10–35)
Albumin: 4.6 g/dL (ref 3.6–5.1)
Alkaline phosphatase (APISO): 49 U/L (ref 37–153)
BUN/Creatinine Ratio: 18 (calc) (ref 6–22)
BUN: 21 mg/dL (ref 7–25)
CO2: 26 mmol/L (ref 20–32)
Calcium: 10.4 mg/dL (ref 8.6–10.4)
Chloride: 104 mmol/L (ref 98–110)
Creat: 1.17 mg/dL — ABNORMAL HIGH (ref 0.50–1.03)
Globulin: 3.2 g/dL (ref 1.9–3.7)
Glucose, Bld: 76 mg/dL (ref 65–99)
Potassium: 4.1 mmol/L (ref 3.5–5.3)
Sodium: 139 mmol/L (ref 135–146)
Total Bilirubin: 0.5 mg/dL (ref 0.2–1.2)
Total Protein: 7.8 g/dL (ref 6.1–8.1)

## 2024-02-27 ENCOUNTER — Encounter: Payer: Self-pay | Admitting: Family Medicine

## 2024-02-27 ENCOUNTER — Other Ambulatory Visit: Payer: Self-pay

## 2024-02-27 ENCOUNTER — Ambulatory Visit
Admission: RE | Admit: 2024-02-27 | Discharge: 2024-02-27 | Disposition: A | Discharge status: Home / Self Care | Source: Ambulatory Visit | Attending: Family Medicine | Admitting: Family Medicine

## 2024-02-27 DIAGNOSIS — N200 Calculus of kidney: Secondary | ICD-10-CM

## 2024-02-27 DIAGNOSIS — R1031 Right lower quadrant pain: Secondary | ICD-10-CM

## 2024-02-27 MED ORDER — IOPAMIDOL (ISOVUE-300) INJECTION 61%
100.0000 mL | Freq: Once | INTRAVENOUS | Status: AC | PRN
  Administered 2024-02-27: 100 mL via INTRAVENOUS

## 2024-02-28 ENCOUNTER — Other Ambulatory Visit: Payer: Self-pay

## 2024-02-28 MED ORDER — TAMSULOSIN HCL 0.4 MG PO CAPS: 0.4000 mg | ORAL_CAPSULE | Freq: Every day | ORAL | 0 refills | Status: DC

## 2024-02-28 MED ORDER — TRAMADOL HCL 50 MG PO TABS: 50.0000 mg | ORAL_TABLET | Freq: Three times a day (TID) | ORAL | 0 refills | Status: AC

## 2024-03-07 ENCOUNTER — Ambulatory Visit (HOSPITAL_BASED_OUTPATIENT_CLINIC_OR_DEPARTMENT_OTHER): Payer: Self-pay | Admitting: Psychiatry

## 2024-03-07 ENCOUNTER — Encounter (HOSPITAL_COMMUNITY): Payer: Self-pay | Admitting: Psychiatry

## 2024-03-07 DIAGNOSIS — F900 Attention-deficit hyperactivity disorder, predominantly inattentive type: Secondary | ICD-10-CM

## 2024-03-07 DIAGNOSIS — Z1331 Encounter for screening for depression: Secondary | ICD-10-CM

## 2024-03-07 DIAGNOSIS — R5383 Other fatigue: Secondary | ICD-10-CM | POA: Diagnosis not present

## 2024-03-07 DIAGNOSIS — F32A Depression, unspecified: Secondary | ICD-10-CM | POA: Diagnosis not present

## 2024-03-07 DIAGNOSIS — F419 Anxiety disorder, unspecified: Secondary | ICD-10-CM

## 2024-03-07 MED ORDER — BUPROPION HCL ER (SR) 200 MG PO TB12: 200.0000 mg | ORAL_TABLET | Freq: Every day | ORAL | 0 refills | Status: DC

## 2024-03-07 NOTE — Progress Notes (Signed)
 Psychiatric Initial Adult Assessment   Patient Identification: Brianna [name] MRN:  [id number] Date of Evaluation:  03/07/2024 Referral Source: primary care Chief Complaint:   Chief Complaint  Patient presents with   Establish Care   Visit Diagnosis:    ICD-10-CM   1. Attention deficit hyperactivity disorder (ADHD), predominantly inattentive type  F90.0     2. Other fatigue  R53.83     3. Depression, unspecified depression type  F32.A buPROPion (WELLBUTRIN SR) 200 MG 12 hr tablet    4. Anxiety  F41.9 buPROPion (WELLBUTRIN SR) 200 MG 12 hr tablet     Virtual Visit via Video Note  I connected with Brianna [name] on 03/07/24 at 10:00 AM EDT by a video enabled telemedicine application and verified that I am speaking with the correct person using two identifiers.  Location: Patient: home Provider: home office   I discussed the limitations of evaluation and management by telemedicine and the availability of in person appointments. The patient expressed understanding and agreed to proceed.      I discussed the assessment and treatment plan with the patient. The patient was provided an opportunity to ask questions and all were answered. The patient agreed with the plan and demonstrated an understanding of the instructions.   The patient was advised to call back or seek an in-person evaluation if the symptoms worsen or if the condition fails to improve as anticipated.  I provided 45 minutes of non-face-to-face time during this encounter.    History of Present Illness: Patient is a 53 years old [place] descent married female has 2 kids she works currently in a Futures trader referred by primary care physician to establish care for ADHD and past history of anxiety  Patient gives a history of being diagnosed with depression, anxiety and ADHD nearly 10 years ago by a psychologist and sees that testing was done and states she has been on Adderall in the past but did not like  it and discontinued recently in the last 6 months she is noticing recurrence of difficulty focusing and job for which she communicated with her primary care physician.  Also was endorsing anxiety and some depression at that time but more so anxiety and Lexapro was given but she did not continue with that she is currently taking Wellbutrin for ADHD which was recently according to her increase a couple of weeks ago to 300 mg  On evaluation today she does not endorse depression on a day-to-day basis she appears to be comfortable smiling and does not endorse hopelessness depression or sadness also not endorse worries fears worries are irregular but not excessive she is not concerned about anxiety or feeling of overwhelmed there is no particular stressor going on she does like her job her relationship is going on well  There is no associated psychotic symptoms delusions hallucination no clear manic symptoms currently or in the past  She feels that her job she gets distracted she loses focus or her work is slow and cannot focus or multitask that affects her performance and she gets worried that what what her coworkers think of her.  At home she does okay she does her household chores but in school or working in cafeteria she has difficulty concentrating  Despite increasing Wellbutrin she has not noticed any significant change or improvement.  She otherwise does not endorse depression or significant anxiety as of now is not taking Ativan either or Lexapro  Aggravating factors; she denies any aggravating factors just getting  somewhat very full her job because of not able to multitask or get her chores done in a timely manner  Modifying factors; her kids, her relationship is going on well  Duration more so in the last 6 months  Hospital admission denies  Suicide attempt denies  Drug use denies   Past Psychiatric History: depression, anxiety, adhd  Previous Psychotropic Medications: Yes  Lexapro,  ativan, adderall Substance Abuse History in the last 12 months:  Yes.    Consequences of Substance Abuse: NA  Past Medical History:  Past Medical History:  Diagnosis Date   Anxiety    Follows w/ PCP @ [facility] in [place].   COVID-19 2022   Patient states that she took an anti-viral.   Encounter for insertion of Mirena IUD 11/09/2011   Genetic testing 10/17/2022   GERD (gastroesophageal reflux disease)    Takes Omeprazole as needed, 11/26/22.   History of migraine headaches    no aura   Hyperlipidemia 09/30/2014   Follows with [facility].   Hypothyroidism 09/30/2014   Follows with [facility] @ [place].   Ovarian cyst 2023   right ovary   Thyroid disease     Past Surgical History:  Procedure Laterality Date   CERVICAL DISCECTOMY     nov 2017 at c6/7   CESAREAN SECTION  x2   2000 and 2002   LAPAROSCOPIC BILATERAL SALPINGECTOMY N/A 11/27/2022   Procedure: LAPAROSCOPIC BILATERAL SALPINGECTOMY, right oophorectomy with collection of pelvic washings;  Surgeon: Patton Salles, MD;  Location: [facility];  Service: Gynecology;  Laterality: N/A;   LAPAROSCOPIC LYSIS OF ADHESIONS N/A 11/27/2022   Procedure: LAPAROSCOPIC LYSIS OF ADHESIONS;  Surgeon: Patton Salles, MD;  Location: [facility];  Service: Gynecology;  Laterality: N/A;   LIPOSUCTION     2004   NASAL SEPTUM SURGERY     2001   NECK SURGERY  2018   RADIUS OSTEOTOMY     july 1990   TONSILLECTOMY     as a child    Family Psychiatric History: Niece: bipolar  Family History:  Family History  Problem Relation Age of Onset   Heart attack Mother    Stroke Mother    Stomach cancer Father 95   Breast cancer Sister 37   Colon cancer Sister 11   Uterine cancer Sister        vs ovarian   Breast cancer Sister 42   Stomach cancer Paternal Aunt    Stomach cancer Paternal Grandmother 25    Social History:   Social History    Socioeconomic History   Marital status: Married    Spouse name: Not on file   Number of children: Not on file   Years of education: Not on file   Highest education level: Not on file  Occupational History   Not on file  Tobacco Use   Smoking status: Never   Smokeless tobacco: Never  Vaping Use   Vaping status: Never Used  Substance and Sexual Activity   Alcohol use: No   Drug use: No   Sexual activity: Yes    Partners: Male    Birth control/protection: None  Other Topics Concern   Not on file  Social History Narrative   Lives with spouse   Works in Audiological scientist   Caffeine: 2+ servings daily, soda   2 children - 1 boy, 1 girl   Automotive engineer grad   Social Drivers  of Health   Financial Resource Strain: Not on file  Food Insecurity: Not on file  Transportation Needs: Not on file  Physical Activity: Not on file  Stress: Not on file  Social Connections: Unknown (04/24/2022)   Received from [facility]   Social Network    Social Network: Not on file    Additional Social History: grew up with parents in [place], no particular concerns or bad memories Has been in  [place] since 2006  Allergies:  No Known Allergies  Metabolic Disorder Labs: No results found for: "HGBA1C", "MPG" No results found for: "PROLACTIN" Lab Results  Component Value Date   CHOL 229 (H) 09/30/2014   TRIG 217 (H) 09/30/2014   HDL 46 09/30/2014   CHOLHDL 5.0 09/30/2014   VLDL 43 (H) 09/30/2014   LDLCALC 140 (H) 09/30/2014   Lab Results  Component Value Date   TSH 3.73 12/25/2023    Therapeutic Level Labs: No results found for: "LITHIUM" No results found for: "CBMZ" No results found for: "VALPROATE"  Current Medications: Current Outpatient Medications  Medication Sig Dispense Refill   tamsulosin (FLOMAX) 0.4 MG CAPS capsule Take 1 capsule (0.4 mg total) by mouth daily. 30 capsule 0   buPROPion (WELLBUTRIN SR) 200 MG 12 hr tablet Take 1 tablet (200 mg total) by mouth daily. 60  tablet 0   fenofibrate 160 MG tablet Take 160 mg by mouth daily.     ibuprofen (ADVIL) 800 MG tablet Take 1 tablet (800 mg total) by mouth daily as needed for headache or moderate pain. (Patient not taking: Reported on 12/25/2023) 30 tablet 0   levothyroxine (SYNTHROID) 75 MCG tablet Take 75 mcg by mouth daily before breakfast.     LORazepam (ATIVAN) 0.5 MG tablet Take 1 tablet (0.5 mg total) by mouth every 8 (eight) hours as needed for anxiety. 30 tablet 0   Multiple Vitamin (MULTIVITAMIN WITH MINERALS) TABS tablet Take 1 tablet by mouth daily.     pantoprazole (PROTONIX) 40 MG tablet Take 40 mg by mouth daily as needed (indigestion).      rosuvastatin (CRESTOR) 20 MG tablet Take 20 mg by mouth at bedtime.     traMADol (ULTRAM) 50 MG tablet Take 1 tablet (50 mg total) by mouth every 8 (eight) hours. 30 tablet 0   valACYclovir (VALTREX) 1000 MG tablet Take 1,000 mg by mouth 2 (two) times daily.     No current facility-administered medications for this visit.    Psychiatric Specialty Exam: Review of Systems  Cardiovascular:  Negative for chest pain.  Psychiatric/Behavioral:  Positive for decreased concentration. Negative for agitation, dysphoric mood, self-injury and sleep disturbance.     There were no vitals taken for this visit.There is no height or weight on file to calculate BMI.  General Appearance: Casual  Eye Contact:  Fair  Speech:  Normal Rate  Volume:  Normal  Mood:  Euthymic  Affect:  Congruent  Thought Process:  Goal Directed  Orientation:  Full (Time, Place, and Person)  Thought Content:  Logical  Suicidal Thoughts:  No  Homicidal Thoughts:  No  Memory:  Immediate;   Fair  Judgement:  Fair  Insight:  Fair  Psychomotor Activity:  Normal  Concentration:  Concentration: Fair  Recall:  [place] Major of Knowledge:Fair  Language: Fair  Akathisia:  No  Handed:    AIMS (if indicated):  not done  Assets:  Financial Resources/Insurance Housing Social Support  ADL's:   Intact  Cognition: WNL  Sleep:  Fair   Screenings: GAD-7    Garment/textile technologist Visit from 12/25/2023 in [facility]  Total GAD-7 Score 12      PHQ2-9    Flowsheet Row Office Visit from 03/07/2024 in [facility] Office Visit from 12/25/2023 in [facility]  PHQ-2 Total Score 0 6  PHQ-9 Total Score -- 19      Flowsheet Row Office Visit from 03/07/[facility] in [facility] Admission (Discharged) from 11/27/2022 in [facility]  C-SSRS RISK CATEGORY No Risk No Risk       Assessment and Plan: as follows  ADHD; inattentive type; states testing was done 10 years ago but she is not able to find it and that practice closed so she is not able to retrieve paperwork.  She does get forgetful and is affecting her performance at times.  We discussed options she agrees not to be on a stimulant which would affect her heart rate or cause anxiety.  She is taking Wellbutrin 300 mg we will increase it to 400 mg refill sent discussed medication side effects or questions if any  Fatigue; as per history but she feels fatigue is fine she is taking CPAP machine for sleep apnea and sleeps well  Anxiety unspecified; managing it well does not endorse any excessive anxiety she takes Ativan as needed but has not noticed or need to be taking it as of now does not feel overwhelmed  Denies depressive symptoms as of now  Reviewed medications also discussed possibility of adding an SSRI for mild ADHD symptoms if needed next visit.  She acknowledges to stay away from stimulant medication that can affect her heart rate  Denies chest pain or palpitations  Labs reviewed questions addressed medication reviewed and sent  Follow-up in 4 weeks or earlier if needed emergency options discussed if needed    Collaboration of Care: Primary Care Provider AEB notes and chart reviewed, labs  reviewed  Patient/Guardian was advised Release of Information must be obtained prior to any record release in order to collaborate their care with an outside provider. Patient/Guardian was advised if they have not already done so to contact the registration department to sign all necessary forms in order for [place] to release information regarding their care.   Consent: Patient/Guardian gives verbal consent for treatment and assignment of benefits for services provided during this visit. Patient/Guardian expressed understanding and agreed to proceed.   Thresa Ross, MD 3/29/202510:16 AM

## 2024-03-16 DIAGNOSIS — N201 Calculus of ureter: Secondary | ICD-10-CM | POA: Diagnosis not present

## 2024-03-23 ENCOUNTER — Other Ambulatory Visit: Payer: Self-pay | Admitting: Family Medicine

## 2024-03-24 NOTE — Telephone Encounter (Signed)
 Requested Prescriptions  Pending Prescriptions Disp Refills   tamsulosin (FLOMAX) 0.4 MG CAPS capsule [Pharmacy Med Name: TAMSULOSIN HCL 0.4 MG CAPSULE] 90 capsule 0    Sig: TAKE 1 CAPSULE BY MOUTH EVERY DAY     Urology: Alpha-Adrenergic Blocker Failed - 03/24/2024  2:14 PM      Failed - PSA in normal range and within 360 days    No results found for: "LABPSA", "PSA", "PSA1", "ULTRAPSA"       Failed - Valid encounter within last 12 months    Recent Outpatient Visits           4 weeks ago Right lower quadrant abdominal pain   [place] [facility] Amadeo June, MD   3 months ago Other fatigue   [place] [facility] Amadeo June, MD              Passed - Last BP in normal range    BP Readings from Last 1 Encounters:  02/25/24 120/80

## 2024-04-20 ENCOUNTER — Other Ambulatory Visit (HOSPITAL_COMMUNITY): Payer: Self-pay | Admitting: Psychiatry

## 2024-04-20 DIAGNOSIS — F32A Depression, unspecified: Secondary | ICD-10-CM

## 2024-04-20 DIAGNOSIS — F419 Anxiety disorder, unspecified: Secondary | ICD-10-CM

## 2024-06-19 ENCOUNTER — Other Ambulatory Visit (HOSPITAL_COMMUNITY): Payer: Self-pay | Admitting: Psychiatry

## 2024-06-19 DIAGNOSIS — F32A Depression, unspecified: Secondary | ICD-10-CM

## 2024-06-19 DIAGNOSIS — F419 Anxiety disorder, unspecified: Secondary | ICD-10-CM

## 2024-06-25 ENCOUNTER — Other Ambulatory Visit: Payer: Self-pay | Admitting: Family Medicine

## 2024-06-26 NOTE — Telephone Encounter (Signed)
 Requested Prescriptions  Pending Prescriptions Disp Refills   tamsulosin  (FLOMAX ) 0.4 MG CAPS capsule [Pharmacy Med Name: TAMSULOSIN  HCL 0.4 MG CAPSULE] 90 capsule 0    Sig: TAKE 1 CAPSULE BY MOUTH EVERY DAY     Urology: Alpha-Adrenergic Blocker Failed - 06/26/2024 10:55 AM      Failed - PSA in normal range and within 360 days    No results found for: LABPSA, PSA, PSA1, ULTRAPSA       Passed - Last BP in normal range    BP Readings from Last 1 Encounters:  02/25/24 120/80         Passed - Valid encounter within last 12 months    Recent Outpatient Visits           4 months ago Right lower quadrant abdominal pain   [place] [facility] Aletha Bene, MD   6 months ago Other fatigue   [place] [facility] Aletha Bene, MD

## 2024-07-29 ENCOUNTER — Encounter: Payer: Self-pay | Admitting: Family Medicine

## 2024-07-29 ENCOUNTER — Ambulatory Visit: Admitting: Family Medicine

## 2024-07-29 ENCOUNTER — Ambulatory Visit: Payer: Self-pay

## 2024-07-29 VITALS — BP 110/75 | HR 102 | Ht 67.0 in | Wt 160.2 lb

## 2024-07-29 DIAGNOSIS — R58 Hemorrhage, not elsewhere classified: Secondary | ICD-10-CM

## 2024-07-29 NOTE — Assessment & Plan Note (Addendum)
 Pt presents to office with a single bruise to her right shin. No signs of multiple bruises or bruising easily. Discussed to watch for this and will obtain CBC if noted. Nothing concerning on exam today for further workup.

## 2024-07-29 NOTE — Progress Notes (Signed)
 Subjective:  HPI: Brianna [name] is a 53 y.o. female presenting on 07/29/2024 for No chief complaint on file.   HPI Patient is in today for right shin pain to touch and a new bruise. Does not recall mechanism of injury and this concerned her. Has been present for about 2 days. No redness, warmth, swelling, drainage. She is not on any blood thinners, ASA, or drink ETOH. Has not noticed she is bruising more easily and cannot identify any other bruises, no bleeding. No history of anemia.  Review of Systems  Endo/Heme/Allergies:  Does not bruise/bleed easily.  All other systems reviewed and are negative.   Relevant past medical history reviewed and updated as indicated.   Past Medical History:  Diagnosis Date   Anxiety    Follows w/ PCP @ [facility] in [place].   COVID-19 2022   Patient states that she took an anti-viral.   Encounter for insertion of Mirena  IUD 11/09/2011   Genetic testing 10/17/2022   GERD (gastroesophageal reflux disease)    Takes Omeprazole as needed, 11/26/22.   History of migraine headaches    no aura   Hyperlipidemia 09/30/2014   Follows with [facility] @ [facility].   Hypothyroidism 09/30/2014   Follows with [facility] @ [place].   Ovarian cyst 2023   right ovary   Thyroid  disease      Past Surgical History:  Procedure Laterality Date   CERVICAL DISCECTOMY     nov 2017 at c6/7   CESAREAN SECTION  x2   2000 and 2002   LAPAROSCOPIC BILATERAL SALPINGECTOMY N/A 11/27/2022   Procedure: LAPAROSCOPIC BILATERAL SALPINGECTOMY, right oophorectomy with collection of pelvic washings;  Surgeon: Cathlyn JAYSON Nikki Bobie FORBES, MD;  Location: [facility];  Service: Gynecology;  Laterality: N/A;   LAPAROSCOPIC LYSIS OF ADHESIONS N/A 11/27/2022   Procedure: LAPAROSCOPIC LYSIS OF ADHESIONS;  Surgeon: Cathlyn JAYSON Nikki Bobie FORBES, MD;  Location: [facility];  Service: Gynecology;  Laterality: N/A;   LIPOSUCTION     2004    NASAL SEPTUM SURGERY     2001   NECK SURGERY  2018   RADIUS OSTEOTOMY     july 1990   TONSILLECTOMY     as a child    Allergies and medications reviewed and updated.   Current Outpatient Medications:    buPROPion  (WELLBUTRIN  SR) 200 MG 12 hr tablet, TAKE 1 TABLET DAILY, Disp: 60 tablet, Rfl: 0   fenofibrate 160 MG tablet, Take 160 mg by mouth daily., Disp: , Rfl:    ibuprofen  (ADVIL ) 800 MG tablet, Take 1 tablet (800 mg total) by mouth daily as needed for headache or moderate pain. (Patient not taking: Reported on 12/25/2023), Disp: 30 tablet, Rfl: 0   levothyroxine (SYNTHROID) 75 MCG tablet, Take 75 mcg by mouth daily before breakfast., Disp: , Rfl:    LORazepam  (ATIVAN ) 0.5 MG tablet, Take 1 tablet (0.5 mg total) by mouth every 8 (eight) hours as needed for anxiety., Disp: 30 tablet, Rfl: 0   Multiple Vitamin (MULTIVITAMIN WITH MINERALS) TABS tablet, Take 1 tablet by mouth daily., Disp: , Rfl:    pantoprazole (PROTONIX) 40 MG tablet, Take 40 mg by mouth daily as needed (indigestion). , Disp: , Rfl:    rosuvastatin (CRESTOR) 20 MG tablet, Take 20 mg by mouth at bedtime., Disp: , Rfl:    tamsulosin  (FLOMAX ) 0.4 MG CAPS capsule, TAKE 1 CAPSULE BY MOUTH EVERY DAY, Disp: 90 capsule, Rfl: 0  valACYclovir (VALTREX) 1000 MG tablet, Take 1,000 mg by mouth 2 (two) times daily., Disp: , Rfl:   No Known Allergies  Objective:   BP 110/75   Pulse (!) 102   Ht 5' 7 (1.702 m)   Wt 160 lb 3.2 oz (72.7 kg)   SpO2 97%   BMI 25.09 kg/m      07/29/2024    3:13 PM 02/25/2024    3:03 PM 12/25/2023    2:03 PM  Vitals with BMI  Height 5' 7 5' 6 5' 6  Weight 160 lbs 3 oz 176 lbs 8 oz 182 lbs  BMI 25.08 28.5 29.39  Systolic 110 120 875  Diastolic 75 80 80  Pulse 102 100 83     Physical Exam Vitals and nursing note reviewed.  Constitutional:      Appearance: Normal appearance. She is normal weight.  HENT:     Head: Normocephalic and atraumatic.  Skin:    General: Skin is warm and  dry.     Findings: Ecchymosis present.         Comments: One bruise to left shin approx 1.5cm, yellow green in color  Neurological:     General: No focal deficit present.     Mental Status: She is alert and oriented to person, place, and time. Mental status is at baseline.  Psychiatric:        Mood and Affect: Mood normal.        Behavior: Behavior normal.        Thought Content: Thought content normal.        Judgment: Judgment normal.     Assessment & Plan:  Ecchymosis Assessment & Plan: Pt presents to office with a single bruise to her right shin. No signs of multiple bruises or bruising easily. Discussed to watch for this and will obtain CBC if noted. Nothing concerning on exam today for further workup.      Follow up plan: Return if symptoms worsen or fail to improve.  Jeoffrey GORMAN Barrio, FNP

## 2024-07-29 NOTE — Telephone Encounter (Signed)
 FYI Only or Action Required?: Action required by provider: request for appointment.  Patient was last seen in primary care on 02/25/2024 by Aletha Bene, MD.  Called Nurse Triage reporting Leg Pain.  Symptoms began several days ago.  Interventions attempted: Nothing.  Symptoms are: unchanged.  Triage Disposition: See Physician Within 24 Hours  Patient/caregiver understands and will follow disposition?: Yes      Copied from CRM #[id number]. Topic: Clinical - Red Word Triage >> Jul 29, 2024  1:18 PM Rachelle R wrote: [facility] that prompted transfer to Nurse Triage: Patient has a bump on her right leg on the her shin that is painful and hot to the touch. Doesn't remember hitting it or anything to cause it, just appeared two days ago. Reason for Disposition  Localized pain, redness or hard lump along vein  Answer Assessment - Initial Assessment Questions Scheduled office visit. Patient reports pain to lower right leg, unknown injury. Denies pain with walking, redness, swelling, warm to touch or drainage.  Patient reports looks purplish green bump with same colored vein behind it, no redness to vein or bump. No known injury, not taking blood thinners  Patient reports looks the same since 2 days, pain is the same, no scratches, drainage, no swelling, size larger than quarter Did not try anything  1. ONSET: When did the pain start?      2 days noticed, but did not injury area 2. LOCATION: Where is the pain located?      Right lower leg, anterior, middle of leg 3. PAIN: How bad is the pain?    (Scale 1-10; or mild, moderate, severe)     2/10, only hurts when touching  5. CAUSE: What do you think is causing the leg pain?     Unsure, probably bumped 6. OTHER SYMPTOMS: Do you have any other symptoms? (e.g., chest pain, back pain, breathing difficulty, swelling, rash, fever, numbness, weakness)     no  Protocols used: Leg Pain-A-AH

## 2024-08-14 ENCOUNTER — Other Ambulatory Visit: Payer: Self-pay | Admitting: Family Medicine

## 2024-08-14 DIAGNOSIS — E039 Hypothyroidism, unspecified: Secondary | ICD-10-CM

## 2024-08-14 NOTE — Telephone Encounter (Signed)
 Copied from CRM [id number]. Topic: Clinical - Medication Question >> Aug 14, 2024  2:33 PM Antwanette L wrote: Reason for CRM: Pt is requesting a refill on synthroid 75mg . This medicine is not listed on the pt chart. Please contact the patient [phone]

## 2024-08-14 NOTE — Telephone Encounter (Signed)
 Requested medications are due for refill today.  unsure  Requested medications are on the active medications list.  yes  Last refill. 01/04/2024  Future visit scheduled.   yes  Notes to clinic.  Medication is historical    Requested Prescriptions  Pending Prescriptions Disp Refills   levothyroxine (SYNTHROID) 75 MCG tablet      Sig: Take 1 tablet (75 mcg total) by mouth daily before breakfast.     Endocrinology:  Hypothyroid Agents Passed - 08/14/2024  4:45 PM      Passed - TSH in normal range and within 360 days    TSH  Date Value Ref Range Status  12/25/2023 3.73 mIU/L Final    Comment:              Reference Range .           > or = 20 Years  0.40-4.50 .                Pregnancy Ranges           First trimester    0.26-2.66           Second trimester   0.55-2.73           Third trimester    0.43-2.91          Passed - Valid encounter within last 12 months    Recent Outpatient Visits           2 weeks ago Ecchymosis   [facility] Kayla Jeoffrey RAMAN, FNP   5 months ago Right lower quadrant abdominal pain   [place] [facility] Aletha Bene, MD   7 months ago Other fatigue   [facility] Aletha Bene, MD

## 2024-08-18 ENCOUNTER — Other Ambulatory Visit

## 2024-08-18 ENCOUNTER — Encounter: Payer: Self-pay | Admitting: Family Medicine

## 2024-08-18 ENCOUNTER — Encounter (INDEPENDENT_AMBULATORY_CARE_PROVIDER_SITE_OTHER): Admitting: Family Medicine

## 2024-08-18 VITALS — Ht 67.0 in

## 2024-08-18 DIAGNOSIS — E039 Hypothyroidism, unspecified: Secondary | ICD-10-CM | POA: Diagnosis not present

## 2024-08-18 MED ORDER — LEVOTHYROXINE SODIUM 75 MCG PO TABS: 75.0000 ug | ORAL_TABLET | Freq: Every day | ORAL | 1 refills | Status: DC

## 2024-08-18 NOTE — Addendum Note (Signed)
 Addended by: ERIKA ELIDA RAMAN on: 08/18/2024 10:44 AM   Modules accepted: Orders

## 2024-08-19 ENCOUNTER — Encounter: Payer: Self-pay | Admitting: Family Medicine

## 2024-08-19 ENCOUNTER — Ambulatory Visit: Payer: Self-pay | Admitting: Family Medicine

## 2024-08-19 LAB — THYROID PANEL WITH TSH
Free Thyroxine Index: 3.4 (ref 1.4–3.8)
T3 Uptake: 33 % (ref 22–35)
T4, Total: 10.3 ug/dL (ref 5.1–11.9)
TSH: 2.04 m[IU]/L

## 2024-08-19 NOTE — Progress Notes (Signed)
 Patient Office Visit  Assessment & Plan:  Hypothyroidism, unspecified type   Assessment and Plan             Patient here for lab work only  No follow-ups on file.   Subjective:    Patient ID: Brianna [name], female    DOB: 1971/07/02  Age: 53 y.o. MRN: [id number]  No chief complaint on file.   HPI Patient was here for lab appointment, not office visit.  History of Present Illness             The ASCVD Risk score (Arnett DK, et al., 2019) failed to calculate for the following reasons:   Cannot find a previous HDL lab   Cannot find a previous total cholesterol lab  Past Medical History:  Diagnosis Date   Anxiety    Follows w/ PCP @ [facility] in [place].   COVID-19 2022   Patient states that she took an anti-viral.   Encounter for insertion of Mirena  IUD 11/09/2011   Genetic testing 10/17/2022   GERD (gastroesophageal reflux disease)    Takes Omeprazole as needed, 11/26/22.   History of migraine headaches    no aura   Hyperlipidemia 09/30/2014   Follows with [facility] @ [facility].   Hypothyroidism 09/30/2014   Follows with [facility] @ [place].   Ovarian cyst 2023   right ovary   Thyroid  disease    Past Surgical History:  Procedure Laterality Date   CERVICAL DISCECTOMY     nov 2017 at c6/7   CESAREAN SECTION  x2   2000 and 2002   LAPAROSCOPIC BILATERAL SALPINGECTOMY N/A 11/27/2022   Procedure: LAPAROSCOPIC BILATERAL SALPINGECTOMY, right oophorectomy with collection of pelvic washings;  Surgeon: Cathlyn JAYSON Nikki Bobie FORBES, MD;  Location: [facility];  Service: Gynecology;  Laterality: N/A;   LAPAROSCOPIC LYSIS OF ADHESIONS N/A 11/27/2022   Procedure: LAPAROSCOPIC LYSIS OF ADHESIONS;  Surgeon: Cathlyn JAYSON Nikki Bobie FORBES, MD;  Location[facility];  Service: Gynecology;  Laterality: N/A;   LIPOSUCTION     2004   NASAL SEPTUM SURGERY     2001   NECK SURGERY  2018   RADIUS OSTEOTOMY     july  1990   TONSILLECTOMY     as a child   Social History   Tobacco Use   Smoking status: Never   Smokeless tobacco: Never  Vaping Use   Vaping status: Never Used  Substance Use Topics   Alcohol use: No   Drug use: No   Family History  Problem Relation Age of Onset   Heart attack Mother    Stroke Mother    Stomach cancer Father 7   Breast cancer Sister 10   Colon cancer Sister 80   Uterine cancer Sister        vs ovarian   Breast cancer Sister 53   Stomach cancer Paternal Aunt    Stomach cancer Paternal Grandmother 44   No Known Allergies  ROS    Objective:    Ht 5' 7 (1.702 m)   BMI 25.09 kg/m  BP Readings from Last 3 Encounters:  07/29/24 110/75  02/25/24 120/80  12/25/23 124/80   Wt Readings from Last 3 Encounters:  07/29/24 160 lb 3.2 oz (72.7 kg)  02/25/24 176 lb 8 oz (80.1 kg)  12/25/23 182 lb (82.6 kg)    Physical Exam   Results for orders placed or performed in visit on 08/18/24  Thyroid   Panel With TSH  Result Value Ref Range   T3 Uptake 33 22 - 35 %   T4, Total 10.3 5.1 - 11.9 mcg/dL   Free Thyroxine Index 3.4 1.4 - 3.8   TSH 2.04 mIU/L

## 2024-08-20 ENCOUNTER — Other Ambulatory Visit: Payer: Self-pay

## 2024-08-20 DIAGNOSIS — E039 Hypothyroidism, unspecified: Secondary | ICD-10-CM

## 2024-08-20 MED ORDER — LEVOTHYROXINE SODIUM 75 MCG PO TABS: 75.0000 ug | ORAL_TABLET | Freq: Every day | ORAL | 1 refills | Status: AC

## 2024-09-28 ENCOUNTER — Encounter (HOSPITAL_COMMUNITY): Payer: Self-pay

## 2024-10-15 ENCOUNTER — Telehealth (HOSPITAL_COMMUNITY): Admitting: Psychiatry

## 2024-10-22 ENCOUNTER — Encounter (HOSPITAL_COMMUNITY): Payer: Self-pay | Admitting: Psychiatry

## 2024-10-22 ENCOUNTER — Encounter

## 2024-10-22 ENCOUNTER — Telehealth (HOSPITAL_COMMUNITY): Admitting: Psychiatry

## 2024-10-22 DIAGNOSIS — R5383 Other fatigue: Secondary | ICD-10-CM

## 2024-10-22 DIAGNOSIS — F419 Anxiety disorder, unspecified: Secondary | ICD-10-CM

## 2024-10-22 DIAGNOSIS — F900 Attention-deficit hyperactivity disorder, predominantly inattentive type: Secondary | ICD-10-CM | POA: Diagnosis not present

## 2024-10-22 DIAGNOSIS — F32A Depression, unspecified: Secondary | ICD-10-CM | POA: Diagnosis not present

## 2024-10-22 DIAGNOSIS — Z1231 Encounter for screening mammogram for malignant neoplasm of breast: Secondary | ICD-10-CM

## 2024-10-22 MED ORDER — ATOMOXETINE HCL 18 MG PO CAPS: 18.0000 mg | ORAL_CAPSULE | Freq: Every day | ORAL | 0 refills | Status: DC

## 2024-10-22 MED ORDER — BUPROPION HCL ER (SR) 200 MG PO TB12: 200.0000 mg | ORAL_TABLET | Freq: Every day | ORAL | 0 refills | Status: DC

## 2024-10-22 NOTE — Progress Notes (Signed)
 [facility] Follow up  visit  Patient Identification: Brianna [name] MRN:  [id number] Date of Evaluation:  10/22/2024 Referral Source: primary care Chief Complaint:   No chief complaint on file. Inattention  Visit Diagnosis:    ICD-10-CM   1. Depression, unspecified depression type  F32.A buPROPion  (WELLBUTRIN  SR) 200 MG 12 hr tablet    2. Anxiety  F41.9 buPROPion  (WELLBUTRIN  SR) 200 MG 12 hr tablet    3. Attention deficit hyperactivity disorder (ADHD), predominantly inattentive type  F90.0     Virtual Visit via Video Note  I connected with Brianna [name] on 10/22/24 at 10:00 AM EST by a video enabled telemedicine application and verified that I am speaking with the correct person using two identifiers.  Location: Patient: home Provider: home office   I discussed the limitations of evaluation and management by telemedicine and the availability of in person appointments. The patient expressed understanding and agreed to proceed.     I discussed the assessment and treatment plan with the patient. The patient was provided an opportunity to ask questions and all were answered. The patient agreed with the plan and demonstrated an understanding of the instructions.   The patient was advised to call back or seek an in-person evaluation if the symptoms worsen or if the condition fails to improve as anticipated.  I provided 18 minutes of non-face-to-face time during this encounter.      History of Present Illness: Patient is a 53 years old [place] descent married female has 2 kids initially  referred by primary care physician to establish care for ADHD and past history of anxiety  Patient gives a history of being diagnosed with depression, anxiety and ADHD nearly 10 years ago by a psychologist and sees that testing was done and states she has been on Adderall in the past but did not like it and discontinued and has been taking wellbutrin  for ADHD   Last seen 6 months ago and she did  not follow up after that.  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  She wants to be on a medication for ADHD and states testing has been done in the past  Aggravating factors; multitasking  modifying factors; kids her relationship is going on well  Duration more than 1 year  Denies drug use denies suicide attempt or admission  Past Psychiatric History: depression, anxiety, adhd  Previous Psychotropic Medications: Yes  Lexapro , ativan , adderall Substance Abuse History in the last 12 months:  Yes.    Consequences of Substance Abuse: NA  Past Medical History:  Past Medical History:  Diagnosis Date   Anxiety    Follows w/ PCP @ [facility] in [place].   COVID-19 2022   Patient states that she took an anti-viral.   Encounter for insertion of Mirena  IUD 11/09/2011   Genetic testing 10/17/2022   GERD (gastroesophageal reflux disease)    Takes Omeprazole as needed, 11/26/22.   History of migraine headaches    no aura   Hyperlipidemia 09/30/2014   Follows with [facility] @ [facility].   Hypothyroidism 09/30/2014   Follows with  [facility] @ [place].   Ovarian cyst 2023   right ovary   Thyroid  disease     Past Surgical History:  Procedure Laterality Date   CERVICAL DISCECTOMY     nov 2017 at c6/7   CESAREAN SECTION  x2   2000 and 2002   LAPAROSCOPIC BILATERAL SALPINGECTOMY N/A 11/27/2022   Procedure: LAPAROSCOPIC BILATERAL SALPINGECTOMY, right oophorectomy with collection of pelvic washings;  Surgeon: Cathlyn JAYSON Nikki Bobie FORBES, MD;  Location: [facility];  Service: Gynecology;   Laterality: N/A;   LAPAROSCOPIC LYSIS OF ADHESIONS N/A 11/27/2022   Procedure: LAPAROSCOPIC LYSIS OF ADHESIONS;  Surgeon: Cathlyn JAYSON Nikki Bobie FORBES, MD;  Location: [facility];  Service: Gynecology;  Laterality: N/A;   LIPOSUCTION     2004   NASAL SEPTUM SURGERY     2001   NECK SURGERY  2018   RADIUS OSTEOTOMY     july 1990   TONSILLECTOMY     as a child    Family Psychiatric History: Niece: bipolar  Family History:  Family History  Problem Relation Age of Onset   Heart attack Mother    Stroke Mother    Stomach cancer Father 51   Breast cancer Sister 29   Colon cancer Sister 30   Uterine cancer Sister        vs ovarian   Breast cancer Sister 46   Stomach cancer Paternal Aunt    Stomach cancer Paternal Grandmother 46    Social History:   Social History   Socioeconomic History   Marital status: Married    Spouse name: Not on file   Number of children: Not on file   Years of education: Not on file   Highest education level: Not on file  Occupational History   Not on file  Tobacco Use   Smoking status: Never   Smokeless tobacco: Never  Vaping Use   Vaping status: Never Used  Substance and Sexual Activity   Alcohol use: No   Drug use: No   Sexual activity: Yes    Partners: Male    Birth control/protection: None  Other Topics Concern   Not on file  Social History Narrative   Lives with spouse   Works in audiological scientist   Caffeine: 2+ servings daily, soda   2 children - 1 boy, 1 girl   Automotive Engineer grad   Social Drivers of Corporate Investment Banker Strain: Not on file  Food Insecurity: Not on file  Transportation Needs: Not on file  Physical Activity: Not on file  Stress: Not on file  Social Connections: Unknown (04/24/2022)   Received from [employer]   Social Network    Social Network: Not on file    Additional Social History: grew up with parents in [place], no particular concerns or bad memories [place] been in  [place]  Allergies:   No Known Allergies  Metabolic Disorder Labs: No results found for: HGBA1C, MPG No results found for: PROLACTIN Lab Results  Component Value Date   CHOL 229 (H) 09/30/2014   TRIG 217 (H) 09/30/2014   HDL 46 09/30/2014   CHOLHDL 5.0 09/30/2014   VLDL 43 (H) 09/30/2014   LDLCALC 140 (H) 09/30/2014   Lab Results  Component Value Date   TSH 2.04 08/18/2024    Therapeutic Level Labs: No results found for: LITHIUM No results found for: CBMZ No results found for: VALPROATE  Current Medications: Current Outpatient Medications  Medication Sig Dispense  Refill   atomoxetine (STRATTERA) 18 MG capsule Take 1 capsule (18 mg total) by mouth daily. 30 capsule 0   buPROPion  (WELLBUTRIN  SR) 200 MG 12 hr tablet Take 1 tablet (200 mg total) by mouth daily. 30 tablet 0   fenofibrate 160 MG tablet Take 160 mg by mouth daily.     ibuprofen  (ADVIL ) 800 MG tablet Take 1 tablet (800 mg total) by mouth daily as needed for headache or moderate pain. (Patient not taking: Reported on 12/25/2023) 30 tablet 0   levothyroxine  (SYNTHROID ) 75 MCG tablet Take 1 tablet (75 mcg total) by mouth daily before breakfast. 90 tablet 1   LORazepam  (ATIVAN ) 0.5 MG tablet Take 1 tablet (0.5 mg total) by mouth every 8 (eight) hours as needed for anxiety. 30 tablet 0   Multiple Vitamin (MULTIVITAMIN WITH MINERALS) TABS tablet Take 1 tablet by mouth daily.     pantoprazole (PROTONIX) 40 MG tablet Take 40 mg by mouth daily as needed (indigestion).      rosuvastatin (CRESTOR) 20 MG tablet Take 20 mg by mouth at bedtime.     tamsulosin  (FLOMAX ) 0.4 MG CAPS capsule TAKE 1 CAPSULE BY MOUTH EVERY DAY 90 capsule 0   valACYclovir (VALTREX) 1000 MG tablet Take 1,000 mg by mouth 2 (two) times daily.     No current facility-administered medications for this visit.    Psychiatric Specialty Exam: Review of Systems  Cardiovascular:  Negative for chest pain.  Psychiatric/Behavioral:  Positive for decreased concentration.  Negative for agitation, dysphoric mood, self-injury and sleep disturbance.     There were no vitals taken for this visit.There is no height or weight on file to calculate BMI.  General Appearance: Casual  Eye Contact:  Fair  Speech:  Normal Rate  Volume:  Normal  Mood:  Euthymic  Affect:  Congruent  Thought Process:  Goal Directed  Orientation:  Full (Time, Place, and Person)  Thought Content:  Logical  Suicidal Thoughts:  No  Homicidal Thoughts:  No  Memory:  Immediate;   Fair  Judgement:  Fair  Insight:  Fair  Psychomotor Activity:  Normal  Concentration:  Concentration: Fair  Recall:  Good  Fund of Knowledge:Fair  Language: Fair  Akathisia:  No  Handed:    AIMS (if indicated):  not done  Assets:  Financial Resources/Insurance Housing Social Support  ADL's:  Intact  Cognition: WNL  Sleep:  Fair   Screenings: GAD-7    Flowsheet Row Office Visit from 12/25/2023 in [facility]  Total GAD-7 Score 12   PHQ2-9    Flowsheet Row [facility] Office Visit from 12/25/2023 in [facility]  PHQ-2 Total Score 0 6  PHQ-9 Total Score -- 19   Flowsheet Row Office Visit from 03/07/2024 in [facility] Admission (Discharged) from 11/27/2022 in [facility]  C-SSRS RISK CATEGORY No Risk No Risk    Assessment and Plan: as follows  Prior documents reviewed  ADHD; inattentive type; states testing was done 10 years ago but she is not able to find it and that practice closed so she was not able to retrieve the paperwork  Remains forgetful and unproductive at times despite being on Wellbutrin .  We will start Strattera 18 mg discussed risks and benefits including to monitor blood pressure and pulse monitor sleep Continue wellbutrin  as well as she feels it helps with depression    Fatigue; as per history; add  Strattera for  inattention continue CPAP machine continue Wellbutrin  and add activities for the daytime   anxiety unspecified; managing it without Ativan  continue distraction from negative thoughts  Reviewed medication questions addressed follow-up in 1 month or earlier if needed     Collaboration of Care: Primary Care Provider AEB notes and chart reviewed, labs reviewed  Patient/Guardian was advised Release of Information must be obtained prior to any record release in order to collaborate their care with an outside provider. Patient/Guardian was advised if they have not already done so to contact the registration department to sign all necessary forms in order for us  to release information regarding their care.   Consent: Patient/Guardian gives verbal consent for treatment and assignment of benefits for services provided during this visit. Patient/Guardian expressed understanding and agreed to proceed.   Jackey Flight, MD 11/13/202510:00 AM

## 2024-11-13 ENCOUNTER — Other Ambulatory Visit (HOSPITAL_COMMUNITY): Payer: Self-pay | Admitting: Psychiatry

## 2024-11-13 DIAGNOSIS — F419 Anxiety disorder, unspecified: Secondary | ICD-10-CM

## 2024-11-13 DIAGNOSIS — F32A Depression, unspecified: Secondary | ICD-10-CM

## 2024-11-18 ENCOUNTER — Telehealth (HOSPITAL_COMMUNITY): Admitting: Psychiatry

## 2024-11-18 ENCOUNTER — Encounter (HOSPITAL_COMMUNITY): Payer: Self-pay | Admitting: Psychiatry

## 2024-11-18 DIAGNOSIS — F900 Attention-deficit hyperactivity disorder, predominantly inattentive type: Secondary | ICD-10-CM

## 2024-11-18 DIAGNOSIS — F32A Depression, unspecified: Secondary | ICD-10-CM

## 2024-11-18 DIAGNOSIS — R5383 Other fatigue: Secondary | ICD-10-CM

## 2024-11-18 DIAGNOSIS — F419 Anxiety disorder, unspecified: Secondary | ICD-10-CM | POA: Diagnosis not present

## 2024-11-18 NOTE — Progress Notes (Signed)
 [facility] Follow up  visit  Patient Identification: Brianna [name] MRN:  [id number] Date of Evaluation:  11/18/2024 Referral Source: primary care Chief Complaint:   No chief complaint on file. Inattention  Visit Diagnosis:    ICD-10-CM   1. Depression, unspecified depression type  F32.A     2. Attention deficit hyperactivity disorder (ADHD), predominantly inattentive type  F90.0     3. Anxiety  F41.9     4. Other fatigue  R53.83     Virtual Visit via Video Note  I connected with Brianna [name] on 11/18/24 at 11:00 AM EST by a video enabled telemedicine application and verified that I am speaking with the correct person using two identifiers.  Location: Patient: home Provider: home office   I discussed the limitations of evaluation and management by telemedicine and the availability of in person appointments. The patient expressed understanding and agreed to proceed.     I discussed the assessment and treatment plan with the patient. The patient was provided an opportunity to ask questions and all were answered. The patient agreed with the plan and demonstrated an understanding of the instructions.   The patient was advised to call back or seek an in-person evaluation if the symptoms worsen or if the condition fails to improve as anticipated.  I provided 16 minutes of non-face-to-face time during this encounter.     History of Present Illness: Patient is a 53 years old [place] descent married female has 2 kids initially  referred by primary care physician to establish care for ADHD and past history of anxiety  Patient gives a history of being diagnosed with depression, anxiety and ADHD nearly 10 years ago by a psychologist and sees that testing was done and states she has been on Adderall in the past but did not like it and discontinued and has been taking wellbutrin  for ADHD   Last seen 6 months ago and she did not follow up after that.  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  So we started Strattera  18 mg  Today she mentions she is doing the same and still gets distracted unable to finish chores at time apparently she has stopped taking Wellbutrin  and she started Strattera   Overall anxiety and depression is manageable  She has been on Ativan  as needed in the past and Lexapro .  Aggravating factors; multitasking, cannot focus modifying factors; kids and her relationship is going on well  Duration more than 1 year  Denies drug use denies suicide attempt or admission  Past Psychiatric History: depression, anxiety, adhd  Previous Psychotropic Medications: Yes  Lexapro , ativan , adderall Substance Abuse History in the last 12 months:  Yes.    Consequences of Substance Abuse: NA  Past Medical History:  Past Medical History:  Diagnosis Date   Anxiety    Follows w/ PCP @ [facility] in [place].   COVID-19 2022   Patient states that she took an anti-viral.   Encounter for insertion of Mirena  IUD 11/09/2011   Genetic testing 10/17/2022   GERD (gastroesophageal reflux disease)    Takes Omeprazole as needed, 11/26/22.   History of migraine headaches    no aura   Hyperlipidemia 09/30/2014   Follows with [facility] @ [facility].   Hypothyroidism 09/30/2014   Follows with [facility] @ [place].   Ovarian cyst 2023  right ovary   Thyroid  disease     Past Surgical History:  Procedure Laterality Date   CERVICAL DISCECTOMY     nov 2017 at c6/7   CESAREAN SECTION  x2   2000 and 2002   LAPAROSCOPIC BILATERAL SALPINGECTOMY N/A 11/27/2022   Procedure: LAPAROSCOPIC BILATERAL SALPINGECTOMY, right oophorectomy with collection of pelvic washings;  Surgeon: Cathlyn JAYSON Nikki Bobie FORBES, MD;  Location: [facility];  Service: Gynecology;  Laterality: N/A;   LAPAROSCOPIC LYSIS OF ADHESIONS N/A  11/27/2022   Procedure: LAPAROSCOPIC LYSIS OF ADHESIONS;  Surgeon: Cathlyn JAYSON Nikki Bobie FORBES, MD;  Location: [facility];  Service: Gynecology;  Laterality: N/A;   LIPOSUCTION     2004   NASAL SEPTUM SURGERY     2001   NECK SURGERY  2018   RADIUS OSTEOTOMY     july 1990   TONSILLECTOMY     as a child    Family Psychiatric History: Niece: bipolar  Family History:  Family History  Problem Relation Age of Onset   Heart attack Mother    Stroke Mother    Stomach cancer Father 62   Breast cancer Sister 68   Colon cancer Sister 51   Uterine cancer Sister        vs ovarian   Breast cancer Sister 62   Stomach cancer Paternal Aunt    Stomach cancer Paternal Grandmother 61    Social History:   Social History   Socioeconomic History   Marital status: Married    Spouse name: Not on file   Number of children: Not on file   Years of education: Not on file   Highest education level: Not on file  Occupational History   Not on file  Tobacco Use   Smoking status: Never   Smokeless tobacco: Never  Vaping Use   Vaping status: Never Used  Substance and Sexual Activity   Alcohol use: No   Drug use: No   Sexual activity: Yes    Partners: Male    Birth control/protection: None  Other Topics Concern   Not on file  Social History Narrative   Lives with spouse   Works in audiological scientist   Caffeine: 2+ servings daily, soda   2 children - 1 boy, 1 girl   Automotive Engineer grad   Social Drivers of Corporate Investment Banker Strain: Not on file  Food Insecurity: Not on file  Transportation Needs: Not on file  Physical Activity: Not on file  Stress: Not on file  Social Connections: Unknown (04/24/2022)   Received from [employer]   Social Network    Social Network: Not on file    Additional Social History: grew up with parents in [place], no particular concerns or bad [place]  Allergies:  No Known Allergies  Metabolic Disorder Labs: No  results found for: HGBA1C, MPG No results found for: PROLACTIN Lab Results  Component Value Date   CHOL 229 (H) 09/30/2014   TRIG 217 (H) 09/30/2014   HDL 46 09/30/2014   CHOLHDL 5.0 09/30/2014   VLDL 43 (H) 09/30/2014   LDLCALC 140 (H) 09/30/2014   Lab Results  Component Value Date   TSH 2.04 08/18/2024    Therapeutic Level Labs: No results found for: LITHIUM No results found for: CBMZ No results found for: VALPROATE  Current Medications: Current Outpatient Medications  Medication Sig Dispense Refill   atomoxetine  (STRATTERA ) 18 MG capsule TAKE 1 CAPSULE BY  MOUTH DAILY. 90 capsule 0   buPROPion  (WELLBUTRIN  SR) 200 MG 12 hr tablet TAKE 1 TABLET BY MOUTH EVERY DAY 90 tablet 0   fenofibrate 160 MG tablet Take 160 mg by mouth daily.     ibuprofen  (ADVIL ) 800 MG tablet Take 1 tablet (800 mg total) by mouth daily as needed for headache or moderate pain. (Patient not taking: Reported on 12/25/2023) 30 tablet 0   levothyroxine  (SYNTHROID ) 75 MCG tablet Take 1 tablet (75 mcg total) by mouth daily before breakfast. 90 tablet 1   LORazepam  (ATIVAN ) 0.5 MG tablet Take 1 tablet (0.5 mg total) by mouth every 8 (eight) hours as needed for anxiety. 30 tablet 0   Multiple Vitamin (MULTIVITAMIN WITH MINERALS) TABS tablet Take 1 tablet by mouth daily.     pantoprazole (PROTONIX) 40 MG tablet Take 40 mg by mouth daily as needed (indigestion).      rosuvastatin (CRESTOR) 20 MG tablet Take 20 mg by mouth at bedtime.     tamsulosin  (FLOMAX ) 0.4 MG CAPS capsule TAKE 1 CAPSULE BY MOUTH EVERY DAY 90 capsule 0   valACYclovir (VALTREX) 1000 MG tablet Take 1,000 mg by mouth 2 (two) times daily.     No current facility-administered medications for this visit.    Psychiatric Specialty Exam: Review of Systems  Cardiovascular:  Negative for chest pain.  Psychiatric/Behavioral:  Positive for decreased concentration. Negative for agitation, dysphoric mood, self-injury and sleep disturbance.      There were no vitals taken for this visit.There is no height or weight on file to calculate BMI.  General Appearance: Casual  Eye Contact:  Fair  Speech:  Normal Rate  Volume:  Normal  Mood:  Euthymic  Affect:  Congruent  Thought Process:  Goal Directed  Orientation:  Full (Time, Place, and Person)  Thought Content:  Logical  Suicidal Thoughts:  No  Homicidal Thoughts:  No  Memory:  Immediate;   Fair  Judgement:  Fair  Insight:  Fair  Psychomotor Activity:  Normal  Concentration:  Concentration: Fair  Recall:  Good  Fund of Knowledge:Fair  Language: Fair  Akathisia:  No  Handed:    AIMS (if indicated):  not done  Assets:  Financial Resources/Insurance Housing Social Support  ADL's:  Intact  Cognition: WNL  Sleep:  Fair   Screenings: GAD-7    Flowsheet Row Office Visit from 12/25/2023 in [facility]  Total GAD-7 Score 12   PHQ2-9    Flowsheet Row [facility] Office Visit from 12/25/2023 in [facility]  PHQ-2 Total Score 0 6  PHQ-9 Total Score -- 19   Flowsheet Row Video Visit from 10/22/2024 in [facility] Office Visit from 03/07/2024 in [facility] Admission (Discharged) from 11/27/2022 in [facility]  C-SSRS RISK CATEGORY No Risk No Risk No Risk    Assessment and Plan: as follows  Prior documentation reviewed  ADHD; inattentive type; have difficulty focusing and becomes forgetful.  She apparently stopped Wellbutrin  so we will continue Wellbutrin  or restart and continue Strattera  at 18 mg otherwise I was thinking of increasing the dose because apparently she has stopped taking Wellbutrin  so we will combine both of them to she can call us  back in a week or 2 if having any concerns or headache or palpitation.  Patient to monitor her blood pressure and  sleep She understands Wellbutrin  is also  for depression and she needs to continue   Fatigue; as per history is continue Strattera  add Wellbutrin  or restart Wellbutrin  see above   anxiety unspecified; managing it without Ativan  continue distraction from negative thoughts   Reviewed medication questions addressed follow-up in 1 month or earlier if needed     Collaboration of Care: Primary Care Provider AEB notes and chart reviewed, labs reviewed  Patient/Guardian was advised Release of Information must be obtained prior to any record release in order to collaborate their care with an outside provider. Patient/Guardian was advised if they have not already done so to contact the registration department to sign all necessary forms in order for us  to release information regarding their care.   Consent: Patient/Guardian gives verbal consent for treatment and assignment of benefits for services provided during this visit. Patient/Guardian expressed understanding and agreed to proceed.   Jackey Flight, MD 12/10/202510:59 AM

## 2024-12-09 ENCOUNTER — Encounter: Admitting: Family Medicine

## 2024-12-11 ENCOUNTER — Encounter: Admitting: Family Medicine

## 2024-12-17 ENCOUNTER — Encounter (HOSPITAL_COMMUNITY): Payer: Self-pay

## 2024-12-23 ENCOUNTER — Encounter (HOSPITAL_COMMUNITY): Payer: Self-pay | Admitting: Psychiatry

## 2024-12-23 ENCOUNTER — Telehealth (INDEPENDENT_AMBULATORY_CARE_PROVIDER_SITE_OTHER): Admitting: Psychiatry

## 2024-12-23 DIAGNOSIS — R5383 Other fatigue: Secondary | ICD-10-CM | POA: Diagnosis not present

## 2024-12-23 DIAGNOSIS — F419 Anxiety disorder, unspecified: Secondary | ICD-10-CM | POA: Diagnosis not present

## 2024-12-23 DIAGNOSIS — F900 Attention-deficit hyperactivity disorder, predominantly inattentive type: Secondary | ICD-10-CM

## 2024-12-23 DIAGNOSIS — F32A Depression, unspecified: Secondary | ICD-10-CM

## 2024-12-23 MED ORDER — ATOMOXETINE HCL 18 MG PO CAPS: 18.0000 mg | ORAL_CAPSULE | Freq: Every day | ORAL | 0 refills | Status: AC

## 2024-12-23 MED ORDER — BUPROPION HCL ER (SR) 200 MG PO TB12: 200.0000 mg | ORAL_TABLET | Freq: Every day | ORAL | 0 refills | Status: AC

## 2024-12-23 NOTE — Progress Notes (Signed)
 [facility] Follow up  visit  Patient Identification: Brianna [name] MRN:  [id number] Date of Evaluation:  12/23/2024 Referral Source: primary care Chief Complaint:   No chief complaint on file. Inattention  Visit Diagnosis:    ICD-10-CM   1. Depression, unspecified depression type  F32.A buPROPion  (WELLBUTRIN  SR) 200 MG 12 hr tablet    2. Attention deficit hyperactivity disorder (ADHD), predominantly inattentive type  F90.0     3. Anxiety  F41.9 buPROPion  (WELLBUTRIN  SR) 200 MG 12 hr tablet    4. Other fatigue  R53.83     Virtual Visit via Video Note  I connected with Brianna [name] on 12/23/2024 at  9:20 AM EST by a video enabled telemedicine application and verified that I am speaking with the correct person using two identifiers.  Location: Patient: home Provider: home office   I discussed the limitations of evaluation and management by telemedicine and the availability of in person appointments. The patient expressed understanding and agreed to proceed.      I discussed the assessment and treatment plan with the patient. The patient was provided an opportunity to ask questions and all were answered. The patient agreed with the plan and demonstrated an understanding of the instructions.   The patient was advised to call back or seek an in-person evaluation if the symptoms worsen or if the condition fails to improve as anticipated.  I provided 17 minutes of non-face-to-face time during this encounter.       History of Present Illness: Patient is a 54 years old [place] descent married female has 2 kids initially  referred by primary care physician to establish care for ADHD and past history of anxiety  Patient gives a history of being diagnosed with depression, anxiety and ADHD nearly 10 years ago by a psychologist and sees that testing was done and states she has been on Adderall in the past but did not like it and discontinued and has been taking wellbutrin  for ADHD    On evaluation patient is doing reasonable she is tolerating Wellbutrin  and Strattera  at 18 mg she does use her CPAP machine still has some amotivation or tiredness during the day but she believes medication are okay with the current dose is  She does have a good support system at home Overall anxiety is manageable does not voice any particular concerns She has been on Ativan  as needed in the past and Lexapro .  Aggravating factors; multitasking can be modifying factors; kids and family  Duration more than 1 year Severity manageable Denies drug use denies suicide attempt or admission  Past Psychiatric History: depression, anxiety, adhd  Previous Psychotropic Medications: Yes  Lexapro , ativan , adderall Substance Abuse History in the last 12 months:  Yes.    Consequences of Substance Abuse: NA  Past Medical History:  Past Medical History:  Diagnosis Date   Anxiety    Follows w/ PCP @ [facility] in [place].   COVID-19 2022   Patient states that she took an anti-viral.   Encounter for insertion of Mirena  IUD 11/09/2011   Genetic testing 10/17/2022   GERD (gastroesophageal reflux disease)    Takes Omeprazole as needed, 11/26/22.   History of migraine headaches    no aura   Hyperlipidemia 09/30/2014   Follows with [facility] @ [facility].   Hypothyroidism 09/30/2014   Follows with [facility] @ [place].   Ovarian cyst 2023   right ovary   Thyroid  disease     Past Surgical  History:  Procedure Laterality Date   CERVICAL DISCECTOMY     nov 2017 at c6/7   CESAREAN SECTION  x2   2000 and 2002   LAPAROSCOPIC BILATERAL SALPINGECTOMY N/A 11/27/2022   Procedure: LAPAROSCOPIC BILATERAL SALPINGECTOMY, right oophorectomy with collection of pelvic washings;  Surgeon: Cathlyn JAYSON Nikki Bobie FORBES, MD;  Location: [facility];  Service: Gynecology;  Laterality: N/A;   LAPAROSCOPIC LYSIS OF ADHESIONS N/A 11/27/2022   Procedure: LAPAROSCOPIC LYSIS OF  ADHESIONS;  Surgeon: Cathlyn JAYSON Nikki Bobie FORBES, MD;  Location: [facility];  Service: Gynecology;  Laterality: N/A;   LIPOSUCTION     2004   NASAL SEPTUM SURGERY     2001   NECK SURGERY  2018   RADIUS OSTEOTOMY     july 1990   TONSILLECTOMY     as a child    Family Psychiatric History: Niece: bipolar  Family History:  Family History  Problem Relation Age of Onset   Heart attack Mother    Stroke Mother    Stomach cancer Father 28   Breast cancer Sister 3   Colon cancer Sister 20   Uterine cancer Sister        vs ovarian   Breast cancer Sister 36   Stomach cancer Paternal Aunt    Stomach cancer Paternal Grandmother 63    Social History:   Social History   Socioeconomic History   Marital status: Married    Spouse name: Not on file   Number of children: Not on file   Years of education: Not on file   Highest education level: Not on file  Occupational History   Not on file  Tobacco Use   Smoking status: Never   Smokeless tobacco: Never  Vaping Use   Vaping status: Never Used  Substance and Sexual Activity   Alcohol use: No   Drug use: No   Sexual activity: Yes    Partners: Male    Birth control/protection: None  Other Topics Concern   Not on file  Social History Narrative   Lives with spouse   Works in audiological scientist   Caffeine: 2+ servings daily, soda   2 children - 1 boy, 1 girl   Automotive Engineer grad   Social Drivers of Health   Tobacco Use: Low Risk (12/23/2024)   Patient History    Smoking Tobacco Use: Never    Smokeless Tobacco Use: Never    Passive Exposure: Not on file  Financial Resource Strain: Not on file  Food Insecurity: Not on file  Transportation Needs: Not on file  Physical Activity: Not on file  Stress: Not on file  Social Connections: Unknown (04/24/2022)   Received from [facility]   Social Network    Social Network: Not on file  Depression (PHQ2-9): Low Risk (03/07/2024)   Depression (PHQ2-9)    PHQ-2 Score: 0  Recent  Concern: Depression (PHQ2-9) - High Risk (12/25/2023)   Depression (PHQ2-9)    PHQ-2 Score: 19  Alcohol Screen: Not on file  Housing: Not on file  Utilities: Not on file  Health Literacy: Not on file    Additional Social History: grew up with parents in [place], no particular concerns or bad [place]  Allergies:  No Known Allergies  Metabolic Disorder Labs: No results found for: HGBA1C, MPG No results found for: PROLACTIN Lab Results  Component Value Date   CHOL 229 (H) 09/30/2014   TRIG 217 (H) 09/30/2014  HDL 46 09/30/2014   CHOLHDL 5.0 09/30/2014   VLDL 43 (H) 09/30/2014   LDLCALC 140 (H) 09/30/2014   Lab Results  Component Value Date   TSH 2.04 08/18/2024    Therapeutic Level Labs: No results found for: LITHIUM No results found for: CBMZ No results found for: VALPROATE  Current Medications: Current Outpatient Medications  Medication Sig Dispense Refill   atomoxetine  (STRATTERA ) 18 MG capsule Take 1 capsule (18 mg total) by mouth daily. 90 capsule 0   buPROPion  (WELLBUTRIN  SR) 200 MG 12 hr tablet Take 1 tablet (200 mg total) by mouth daily. 90 tablet 0   fenofibrate 160 MG tablet Take 160 mg by mouth daily.     ibuprofen  (ADVIL ) 800 MG tablet Take 1 tablet (800 mg total) by mouth daily as needed for headache or moderate pain. (Patient not taking: Reported on 12/25/2023) 30 tablet 0   levothyroxine  (SYNTHROID ) 75 MCG tablet Take 1 tablet (75 mcg total) by mouth daily before breakfast. 90 tablet 1   LORazepam  (ATIVAN ) 0.5 MG tablet Take 1 tablet (0.5 mg total) by mouth every 8 (eight) hours as needed for anxiety. 30 tablet 0   Multiple Vitamin (MULTIVITAMIN WITH MINERALS) TABS tablet Take 1 tablet by mouth daily.     pantoprazole (PROTONIX) 40 MG tablet Take 40 mg by mouth daily as needed (indigestion).      rosuvastatin (CRESTOR) 20 MG tablet Take 20 mg by mouth at bedtime.     tamsulosin  (FLOMAX ) 0.4 MG CAPS capsule TAKE 1  CAPSULE BY MOUTH EVERY DAY 90 capsule 0   valACYclovir (VALTREX) 1000 MG tablet Take 1,000 mg by mouth 2 (two) times daily.     No current facility-administered medications for this visit.    Psychiatric Specialty Exam: Review of Systems  Cardiovascular:  Negative for chest pain.  Psychiatric/Behavioral:  Positive for decreased concentration. Negative for agitation, dysphoric mood, self-injury and sleep disturbance.     There were no vitals taken for this visit.There is no height or weight on file to calculate BMI.  General Appearance: Casual  Eye Contact:  Fair  Speech:  Normal Rate  Volume:  Normal  Mood:  Euthymic  Affect:  Congruent  Thought Process:  Goal Directed  Orientation:  Full (Time, Place, and Person)  Thought Content:  Logical  Suicidal Thoughts:  No  Homicidal Thoughts:  No  Memory:  Immediate;   Fair  Judgement:  Fair  Insight:  Fair  Psychomotor Activity:  Normal  Concentration:  Concentration: Fair  Recall:  Good  Fund of Knowledge:Fair  Language: Fair  Akathisia:  No  Handed:    AIMS (if indicated):  not done  Assets:  Financial Resources/Insurance Housing Social Support  ADL's:  Intact  Cognition: WNL  Sleep:  Fair   Screenings: GAD-7    Flowsheet Row Office Visit from 12/25/2023 in [facility]  Total GAD-7 Score 12   PHQ2-9    Flowsheet Row Office Visit from 03/07/2024 in [facility] Office Visit from 12/25/2023 in [facility]  PHQ-2 Total Score 0 6  PHQ-9 Total Score -- 19   Flowsheet Row Video Visit from 10/22/2024 in [facility] Office Visit from 03/07/2024 in [facility] Admission (Discharged) from 11/27/2022 in [facility]  C-SSRS RISK CATEGORY No Risk No Risk No Risk    Assessment and Plan: as follows Prior documentation reviewed  ADHD;  inattentive  type; manageable some tiredness during the day but she is able to multitask with some concerns that time she is okay with the dose of Strattera  and Wellbutrin  as of now  Fatigue; some better discussed sleep hygiene continue Strattera  and also use CPAP machine at night   anxiety unspecified; managing it continue distract from negative thoughts and Ativan  if needed on as needed basis   reviewed medication questions addressed follow-up in 3 to 4 months or earlier if needed    Collaboration of Care: Primary Care Provider AEB notes and chart reviewed, labs reviewed  Patient/Guardian was advised Release of Information must be obtained prior to any record release in order to collaborate their care with an outside provider. Patient/Guardian was advised if they have not already done so to contact the registration department to sign all necessary forms in order for us  to release information regarding their care.   Consent: Patient/Guardian gives verbal consent for treatment and assignment of benefits for services provided during this visit. Patient/Guardian expressed understanding and agreed to proceed.   Jackey Flight, MD 1/14/20269:17 AM

## 2024-12-28 ENCOUNTER — Other Ambulatory Visit: Payer: Self-pay | Admitting: Obstetrics and Gynecology

## 2024-12-28 DIAGNOSIS — Z1231 Encounter for screening mammogram for malignant neoplasm of breast: Secondary | ICD-10-CM

## 2025-01-05 ENCOUNTER — Ambulatory Visit

## 2025-01-05 ENCOUNTER — Ambulatory Visit: Admitting: Family Medicine

## 2025-01-05 DIAGNOSIS — Z1231 Encounter for screening mammogram for malignant neoplasm of breast: Secondary | ICD-10-CM

## 2025-01-11 ENCOUNTER — Encounter: Admitting: Family Medicine

## 2025-02-15 ENCOUNTER — Encounter: Admitting: Family Medicine

## 2025-03-24 ENCOUNTER — Telehealth (HOSPITAL_COMMUNITY): Admitting: Psychiatry
